# Patient Record
Sex: Female | Born: 1980 | Race: Black or African American | Hispanic: No | Marital: Single | Smoking: Current every day smoker
Health system: Southern US, Community
[De-identification: ages and names within clinical notes are randomized; demographics above are authoritative.]

## PROBLEM LIST (undated history)

## (undated) ENCOUNTER — Inpatient Hospital Stay (HOSPITAL_COMMUNITY): Payer: Self-pay

## (undated) DIAGNOSIS — R760 Raised antibody titer: Secondary | ICD-10-CM

## (undated) HISTORY — DX: Raised antibody titer: R76.0

---
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
## 2003-03-12 ENCOUNTER — Emergency Department (HOSPITAL_COMMUNITY): Admission: EM | Admit: 2003-03-12 | Discharge: 2003-03-12 | Payer: Self-pay | Admitting: *Deleted

## 2003-08-18 ENCOUNTER — Emergency Department (HOSPITAL_COMMUNITY): Admission: EM | Admit: 2003-08-18 | Discharge: 2003-08-19 | Payer: Self-pay | Admitting: Emergency Medicine

## 2004-07-24 ENCOUNTER — Inpatient Hospital Stay (HOSPITAL_COMMUNITY): Admission: AD | Admit: 2004-07-24 | Discharge: 2004-07-24 | Payer: Self-pay | Admitting: *Deleted

## 2004-07-27 ENCOUNTER — Inpatient Hospital Stay (HOSPITAL_COMMUNITY): Admission: AD | Admit: 2004-07-27 | Discharge: 2004-07-27 | Payer: Self-pay | Admitting: *Deleted

## 2004-08-18 ENCOUNTER — Inpatient Hospital Stay (HOSPITAL_COMMUNITY): Admission: AD | Admit: 2004-08-18 | Discharge: 2004-08-19 | Payer: Self-pay | Admitting: *Deleted

## 2004-08-24 ENCOUNTER — Emergency Department (HOSPITAL_COMMUNITY): Admission: EM | Admit: 2004-08-24 | Discharge: 2004-08-24 | Payer: Self-pay | Admitting: Emergency Medicine

## 2004-09-02 ENCOUNTER — Inpatient Hospital Stay (HOSPITAL_COMMUNITY): Admission: AD | Admit: 2004-09-02 | Discharge: 2004-09-02 | Payer: Self-pay | Admitting: Family Medicine

## 2004-09-09 ENCOUNTER — Inpatient Hospital Stay (HOSPITAL_COMMUNITY): Admission: AD | Admit: 2004-09-09 | Discharge: 2004-09-09 | Payer: Self-pay | Admitting: *Deleted

## 2004-10-25 ENCOUNTER — Ambulatory Visit (HOSPITAL_COMMUNITY): Admission: RE | Admit: 2004-10-25 | Discharge: 2004-10-25 | Payer: Self-pay | Admitting: Obstetrics and Gynecology

## 2004-12-21 ENCOUNTER — Ambulatory Visit (HOSPITAL_COMMUNITY): Admission: RE | Admit: 2004-12-21 | Discharge: 2004-12-21 | Payer: Self-pay | Admitting: Obstetrics and Gynecology

## 2005-03-05 ENCOUNTER — Inpatient Hospital Stay (HOSPITAL_COMMUNITY): Admission: AD | Admit: 2005-03-05 | Discharge: 2005-03-06 | Payer: Self-pay | Admitting: *Deleted

## 2005-03-05 ENCOUNTER — Ambulatory Visit: Payer: Self-pay | Admitting: *Deleted

## 2005-03-28 ENCOUNTER — Ambulatory Visit: Payer: Self-pay | Admitting: Family Medicine

## 2005-03-28 ENCOUNTER — Inpatient Hospital Stay (HOSPITAL_COMMUNITY): Admission: AD | Admit: 2005-03-28 | Discharge: 2005-03-31 | Payer: Self-pay | Admitting: Obstetrics & Gynecology

## 2007-09-25 ENCOUNTER — Ambulatory Visit (HOSPITAL_COMMUNITY): Admission: RE | Admit: 2007-09-25 | Discharge: 2007-09-25 | Payer: Self-pay | Admitting: Family Medicine

## 2007-10-07 ENCOUNTER — Ambulatory Visit: Payer: Self-pay | Admitting: Obstetrics & Gynecology

## 2007-11-11 ENCOUNTER — Ambulatory Visit: Payer: Self-pay | Admitting: Obstetrics & Gynecology

## 2007-12-02 ENCOUNTER — Encounter: Payer: Self-pay | Admitting: Obstetrics and Gynecology

## 2007-12-02 ENCOUNTER — Ambulatory Visit: Payer: Self-pay | Admitting: Obstetrics & Gynecology

## 2007-12-02 LAB — CONVERTED CEMR LAB
HCT: 34.9 % — ABNORMAL LOW (ref 36.0–46.0)
Hemoglobin: 11.8 g/dL — ABNORMAL LOW (ref 12.0–15.0)
MCHC: 33.8 g/dL (ref 30.0–36.0)
MCV: 92.8 fL (ref 78.0–100.0)
Platelets: 226 10*3/uL (ref 150–400)
RBC: 3.76 M/uL — ABNORMAL LOW (ref 3.87–5.11)
RDW: 13.8 % (ref 11.5–15.5)
WBC: 7.4 10*3/uL (ref 4.0–10.5)

## 2007-12-16 ENCOUNTER — Ambulatory Visit: Payer: Self-pay | Admitting: Obstetrics & Gynecology

## 2008-01-06 ENCOUNTER — Ambulatory Visit: Payer: Self-pay | Admitting: Obstetrics & Gynecology

## 2008-01-20 ENCOUNTER — Encounter: Payer: Self-pay | Admitting: Physician Assistant

## 2008-01-20 ENCOUNTER — Ambulatory Visit: Payer: Self-pay | Admitting: Obstetrics & Gynecology

## 2008-01-20 LAB — CONVERTED CEMR LAB
Chlamydia, DNA Probe: NEGATIVE
GC Probe Amp, Genital: NEGATIVE

## 2008-01-26 ENCOUNTER — Inpatient Hospital Stay (HOSPITAL_COMMUNITY): Admission: AD | Admit: 2008-01-26 | Discharge: 2008-01-26 | Payer: Self-pay | Admitting: Family Medicine

## 2008-01-26 ENCOUNTER — Ambulatory Visit: Payer: Self-pay | Admitting: Obstetrics & Gynecology

## 2008-01-27 ENCOUNTER — Ambulatory Visit: Payer: Self-pay | Admitting: Obstetrics & Gynecology

## 2008-02-03 ENCOUNTER — Ambulatory Visit: Payer: Self-pay | Admitting: Obstetrics & Gynecology

## 2008-02-10 ENCOUNTER — Ambulatory Visit: Payer: Self-pay | Admitting: Obstetrics & Gynecology

## 2008-02-15 ENCOUNTER — Inpatient Hospital Stay (HOSPITAL_COMMUNITY): Admission: AD | Admit: 2008-02-15 | Discharge: 2008-02-17 | Payer: Self-pay | Admitting: Family Medicine

## 2008-02-15 ENCOUNTER — Ambulatory Visit: Payer: Self-pay | Admitting: Obstetrics and Gynecology

## 2009-08-09 ENCOUNTER — Ambulatory Visit: Payer: Self-pay | Admitting: Obstetrics and Gynecology

## 2009-08-09 LAB — CONVERTED CEMR LAB
Clue Cells Wet Prep HPF POC: NONE SEEN
Hgb A2 Quant: 2.8 % (ref 2.2–3.2)
Hgb A: 96.8 % (ref 96.8–97.8)
Hgb F Quant: 0.4 % (ref 0.0–2.0)
Hgb S Quant: 0 % (ref 0.0–0.0)
Trich, Wet Prep: NONE SEEN
Yeast Wet Prep HPF POC: NONE SEEN

## 2009-08-15 ENCOUNTER — Ambulatory Visit (HOSPITAL_COMMUNITY): Admission: RE | Admit: 2009-08-15 | Discharge: 2009-08-15 | Payer: Self-pay | Admitting: Family Medicine

## 2009-09-06 ENCOUNTER — Ambulatory Visit: Payer: Self-pay | Admitting: Obstetrics and Gynecology

## 2009-09-06 ENCOUNTER — Encounter: Payer: Self-pay | Admitting: Obstetrics and Gynecology

## 2009-09-06 LAB — CONVERTED CEMR LAB
Antibody Screen: NEGATIVE
Basophils Absolute: 0 10*3/uL (ref 0.0–0.1)
Basophils Relative: 0 % (ref 0–1)
Eosinophils Absolute: 0 10*3/uL (ref 0.0–0.7)
Eosinophils Relative: 1 % (ref 0–5)
HCT: 38.1 % (ref 36.0–46.0)
Hemoglobin: 12.8 g/dL (ref 12.0–15.0)
Hepatitis B Surface Ag: NEGATIVE
Lymphocytes Relative: 34 % (ref 12–46)
Lymphs Abs: 2.4 10*3/uL (ref 0.7–4.0)
MCHC: 33.6 g/dL (ref 30.0–36.0)
MCV: 93.2 fL (ref 78.0–100.0)
Monocytes Absolute: 0.7 10*3/uL (ref 0.1–1.0)
Monocytes Relative: 11 % (ref 3–12)
Neutro Abs: 3.9 10*3/uL (ref 1.7–7.7)
Neutrophils Relative %: 55 % (ref 43–77)
Platelets: 267 10*3/uL (ref 150–400)
RBC: 4.09 M/uL (ref 3.87–5.11)
RDW: 14.1 % (ref 11.5–15.5)
Rh Type: POSITIVE
Rubella: 71.6 intl units/mL — ABNORMAL HIGH
WBC: 7.1 10*3/uL (ref 4.0–10.5)

## 2009-09-27 ENCOUNTER — Inpatient Hospital Stay (HOSPITAL_COMMUNITY): Admission: AD | Admit: 2009-09-27 | Discharge: 2009-09-27 | Payer: Self-pay | Admitting: Obstetrics & Gynecology

## 2009-09-27 ENCOUNTER — Ambulatory Visit: Payer: Self-pay | Admitting: Nurse Practitioner

## 2009-10-04 ENCOUNTER — Ambulatory Visit: Payer: Self-pay | Admitting: Obstetrics and Gynecology

## 2009-10-05 ENCOUNTER — Ambulatory Visit (HOSPITAL_COMMUNITY): Admission: RE | Admit: 2009-10-05 | Discharge: 2009-10-05 | Payer: Self-pay | Admitting: Family Medicine

## 2009-11-01 ENCOUNTER — Ambulatory Visit: Payer: Self-pay | Admitting: Obstetrics and Gynecology

## 2009-11-29 ENCOUNTER — Ambulatory Visit: Payer: Self-pay | Admitting: Obstetrics and Gynecology

## 2009-12-20 ENCOUNTER — Encounter: Payer: Self-pay | Admitting: Physician Assistant

## 2009-12-20 ENCOUNTER — Ambulatory Visit: Payer: Self-pay | Admitting: Obstetrics and Gynecology

## 2009-12-20 LAB — CONVERTED CEMR LAB
HCT: 35.8 % — ABNORMAL LOW (ref 36.0–46.0)
Hemoglobin: 12 g/dL (ref 12.0–15.0)
MCHC: 33.5 g/dL (ref 30.0–36.0)
MCV: 95.2 fL (ref 78.0–100.0)
Platelets: 243 10*3/uL (ref 150–400)
RBC: 3.76 M/uL — ABNORMAL LOW (ref 3.87–5.11)
RDW: 13.9 % (ref 11.5–15.5)
WBC: 7.6 10*3/uL (ref 4.0–10.5)

## 2010-01-10 ENCOUNTER — Ambulatory Visit: Payer: Self-pay | Admitting: Obstetrics and Gynecology

## 2010-01-24 ENCOUNTER — Ambulatory Visit: Payer: Self-pay | Admitting: Obstetrics and Gynecology

## 2010-02-07 ENCOUNTER — Encounter: Payer: Self-pay | Admitting: Obstetrics and Gynecology

## 2010-02-07 ENCOUNTER — Ambulatory Visit: Payer: Self-pay | Admitting: Obstetrics and Gynecology

## 2010-02-07 LAB — CONVERTED CEMR LAB
Chlamydia, DNA Probe: NEGATIVE
GC Probe Amp, Genital: NEGATIVE

## 2010-02-12 ENCOUNTER — Inpatient Hospital Stay (HOSPITAL_COMMUNITY)
Admission: AD | Admit: 2010-02-12 | Discharge: 2010-02-12 | Payer: Self-pay | Source: Home / Self Care | Attending: Obstetrics & Gynecology | Admitting: Obstetrics & Gynecology

## 2010-02-14 ENCOUNTER — Ambulatory Visit
Admission: RE | Admit: 2010-02-14 | Discharge: 2010-02-14 | Payer: Self-pay | Source: Home / Self Care | Attending: Obstetrics and Gynecology | Admitting: Obstetrics and Gynecology

## 2010-02-14 LAB — POCT URINALYSIS DIPSTICK
Bilirubin Urine: NEGATIVE
Hemoglobin, Urine: NEGATIVE
Ketones, ur: NEGATIVE mg/dL
Nitrite: NEGATIVE
Protein, ur: 30 mg/dL — AB
Specific Gravity, Urine: >=1.03 (ref 1.005–1.030)
Urine Glucose, Fasting: NEGATIVE mg/dL
Urobilinogen, UA: 0.2 mg/dL (ref 0.0–1.0)
pH: 6.5 (ref 5.0–8.0)

## 2010-02-17 ENCOUNTER — Inpatient Hospital Stay (HOSPITAL_COMMUNITY)
Admission: AD | Admit: 2010-02-17 | Discharge: 2010-02-20 | Payer: Self-pay | Source: Home / Self Care | Attending: Obstetrics & Gynecology | Admitting: Obstetrics & Gynecology

## 2010-02-21 ENCOUNTER — Ambulatory Visit: Admit: 2010-02-21 | Payer: Self-pay | Admitting: Obstetrics and Gynecology

## 2010-02-26 LAB — CBC
HCT: 36 % (ref 36.0–46.0)
HCT: 36.4 % (ref 36.0–46.0)
Hemoglobin: 12.3 g/dL (ref 12.0–15.0)
Hemoglobin: 12.2 g/dL (ref 12.0–15.0)
MCH: 31.8 pg (ref 26.0–34.0)
MCH: 31.4 pg (ref 26.0–34.0)
MCHC: 34.2 g/dL (ref 30.0–36.0)
MCHC: 33.5 g/dL (ref 30.0–36.0)
MCV: 93 fL (ref 78.0–100.0)
MCV: 93.8 fL (ref 78.0–100.0)
Platelets: 221 10*3/uL (ref 150–400)
Platelets: 214 10*3/uL (ref 150–400)
RBC: 3.87 MIL/uL (ref 3.87–5.11)
RBC: 3.88 MIL/uL (ref 3.87–5.11)
RDW: 14.3 % (ref 11.5–15.5)
RDW: 14.5 % (ref 11.5–15.5)
WBC: 8.7 10*3/uL (ref 4.0–10.5)
WBC: 10.4 10*3/uL (ref 4.0–10.5)

## 2010-02-26 LAB — RPR: RPR Ser Ql: NONREACTIVE

## 2010-03-28 ENCOUNTER — Ambulatory Visit: Payer: Self-pay | Admitting: Family Medicine

## 2010-04-23 LAB — POCT URINALYSIS DIPSTICK
Bilirubin Urine: NEGATIVE
Bilirubin Urine: NEGATIVE
Glucose, UA: NEGATIVE mg/dL
Glucose, UA: NEGATIVE mg/dL
Hgb urine dipstick: NEGATIVE
Hgb urine dipstick: NEGATIVE
Ketones, ur: NEGATIVE mg/dL
Ketones, ur: NEGATIVE mg/dL
Nitrite: NEGATIVE
Nitrite: NEGATIVE
Protein, ur: 30 mg/dL — AB
Protein, ur: NEGATIVE mg/dL
Specific Gravity, Urine: >=1.03 (ref 1.005–1.030)
Specific Gravity, Urine: >=1.03 (ref 1.005–1.030)
Urobilinogen, UA: 0.2 mg/dL (ref 0.0–1.0)
Urobilinogen, UA: 0.2 mg/dL (ref 0.0–1.0)
pH: 6.5 (ref 5.0–8.0)
pH: 6.5 (ref 5.0–8.0)

## 2010-04-24 LAB — POCT URINALYSIS DIPSTICK
Hgb urine dipstick: NEGATIVE
Ketones, ur: NEGATIVE mg/dL
Protein, ur: 30 mg/dL — AB
Protein, ur: NEGATIVE mg/dL
Specific Gravity, Urine: >=1.03 (ref 1.005–1.030)
Specific Gravity, Urine: >=1.03 (ref 1.005–1.030)
Urobilinogen, UA: 0.2 mg/dL (ref 0.0–1.0)
pH: 6.5 (ref 5.0–8.0)

## 2010-04-25 LAB — POCT URINALYSIS DIPSTICK
Hgb urine dipstick: NEGATIVE
Nitrite: NEGATIVE
Protein, ur: NEGATIVE mg/dL
Urobilinogen, UA: 0.2 mg/dL (ref 0.0–1.0)
pH: 6.5 (ref 5.0–8.0)

## 2010-04-26 LAB — POCT URINALYSIS DIPSTICK
Ketones, ur: NEGATIVE mg/dL
Nitrite: NEGATIVE
Protein, ur: NEGATIVE mg/dL
Protein, ur: NEGATIVE mg/dL
pH: 6 (ref 5.0–8.0)
pH: 6 (ref 5.0–8.0)

## 2010-04-26 LAB — URINALYSIS, ROUTINE W REFLEX MICROSCOPIC
Bilirubin Urine: NEGATIVE
Ketones, ur: NEGATIVE mg/dL
Nitrite: NEGATIVE
pH: 6 (ref 5.0–8.0)

## 2010-04-28 LAB — POCT URINALYSIS DIP (DEVICE)
Hgb urine dipstick: NEGATIVE
Protein, ur: NEGATIVE mg/dL
Specific Gravity, Urine: 1.025 (ref 1.005–1.030)
Urobilinogen, UA: 0.2 mg/dL (ref 0.0–1.0)

## 2010-04-29 LAB — POCT URINALYSIS DIP (DEVICE)
Glucose, UA: NEGATIVE mg/dL
Hgb urine dipstick: NEGATIVE
Nitrite: NEGATIVE
Protein, ur: NEGATIVE mg/dL
Urobilinogen, UA: 0.2 mg/dL (ref 0.0–1.0)
pH: 5.5 (ref 5.0–8.0)

## 2010-05-04 NOTE — Progress Notes (Signed)
Patricia Gray, Patricia Gray          ACCOUNT NO.:  [id number]  MEDICAL RECORD NO.:  [id number]           PATIENT TYPE:  A  LOCATION:  [facility]                   FACILITY:  [facility]  PHYSICIAN:  Lucina Mellow, DO   DATE OF BIRTH:  10-May-1980  DATE OF SERVICE:  03/28/2010                                 CLINIC NOTE  The patient was seen in the [facility] on the afternoon of March 28, 2010.  Her chief complaint was for a postpartum check.  HISTORY OF PRESENT ILLNESS:  The patient is a 29-year gravida 5, para 4- 0-1-4 status post spontaneous vaginal delivery on February 18, 2010.  She states that she is bottle feeding her infant, this is going well.  She is not having any breast pain but occasionally she does notice some breast leakage which she does not associate with any discomfort or concern.  She is not having any incontinence or dysuria.  She is concerned she has a hemorrhoid, although she has not noted any blood with wiping.  She states she does have pain with bowel movement and pain after her bowel movement.  She denies that she is having any "baby blues" but does report that her appetite is decreased.  She did not have much of an appetite during her pregnancy either.  This really has not changed but she is just concerned that maybe her body is not getting the nourishment that it needs.  She had a Depo shot at the time of discharge after delivery of her baby but she is interested perhaps in an IUD.  OBSTETRICAL HISTORY:  Significant for 5 pregnancies, 4 term deliveries by vagina and 1 spontaneous abortion.  Her most recent delivery was at 34 and 6 weeks here at [facility].  She had no vaginal lacerations or tears.  She has uncomplicated pregnancy course.  PHYSICAL EXAMINATION:  VITAL SIGNS:  She has a temperature of 98.4, pulse is 79, blood pressure of 131/81, weight of 209.3, height of 63 inches. GENERAL:  She is a pleasant African American female who looks her  stated age of 40 years of old. HEART:  Regular rate and rhythm with no murmurs. LUNGS:  Clear to auscultation bilaterally. ABDOMEN:  Soft and benign with positive bowel sounds. GENITOURINARY:  Her perineum and vagina are inspected.  She has no swelling or noted discharge on the outside.  Speculum exam was not performed. RECTAL:  She has what appears to be rectal fissure at about 6 o'clock. There is no hemorrhoid appreciated.  She has good rectal tone.  ASSESSMENT: 1. Postpartum exam.  The patient has no complaints of her postpartum     health at this time.  Her Edinburgh Postnatal Depression Scale     score is 430 which is in the low risk.  So no treatment necessary     at this time.  Her birth control currently is a Depo-Provera shot.     She wishes to have something else, perhaps an IUD, and she is going     to contact her insurance carrier and find out what her co-pay is     and follow up and make an appointment  for an IUD placement as     desired.  Otherwise, she needs to let us know if she wants another     Depo shot or if she wishes to go on birth control pills or other     form of birth control, so we can help her out. 2. Rectal fissure.  The patient states that her bowel movements are     loose, however, she does __________ after push to have a bowel     movement.  It is recommended that she get over-the-counter stool     softener such as Colace to take it 1-2 times a day.  She is given a     prescription for diltiazem gel which could be compounded at one of     the local pharmacies, recommended to go to [facility] to whom I     spoke to and to apply this 4 times a day rectally for at least a     month and see if this improves her symptoms.  If her symptoms     worsen or do not resolve, it is recommended that she follow up with     her  primary care physician so she can be seen by general surgeon     as necessary to address this issue as it may warrant.  The patient      voices understanding and agrees with these plans.          ______________________________ Lucina Mellow, DO    SH/MEDQ  D:  03/28/2010  T:  03/29/2010  Job:  [id number]

## 2010-05-28 LAB — CBC
MCHC: 33.7 g/dL (ref 30.0–36.0)
Platelets: 218 10*3/uL (ref 150–400)
RDW: 14.4 % (ref 11.5–15.5)

## 2010-05-28 LAB — RPR: RPR Ser Ql: NONREACTIVE

## 2010-11-12 LAB — POCT URINALYSIS DIP (DEVICE)
Bilirubin Urine: NEGATIVE
Bilirubin Urine: NEGATIVE
Glucose, UA: NEGATIVE
Glucose, UA: NEGATIVE
Ketones, ur: NEGATIVE
Ketones, ur: NEGATIVE
Nitrite: NEGATIVE
Nitrite: NEGATIVE
Operator id: 287931
pH: 7

## 2010-11-13 LAB — POCT URINALYSIS DIP (DEVICE)
Bilirubin Urine: NEGATIVE
Bilirubin Urine: NEGATIVE
Glucose, UA: NEGATIVE
Glucose, UA: NEGATIVE
Ketones, ur: NEGATIVE
Ketones, ur: NEGATIVE
Nitrite: NEGATIVE
Operator id: 148111
Protein, ur: 100 — AB
Specific Gravity, Urine: 1.02
pH: 7.5

## 2010-11-15 LAB — POCT URINALYSIS DIP (DEVICE)
Bilirubin Urine: NEGATIVE
Bilirubin Urine: NEGATIVE
Bilirubin Urine: NEGATIVE
Bilirubin Urine: NEGATIVE
Glucose, UA: NEGATIVE mg/dL
Glucose, UA: NEGATIVE mg/dL
Glucose, UA: NEGATIVE mg/dL
Glucose, UA: NEGATIVE mg/dL
Hgb urine dipstick: NEGATIVE
Hgb urine dipstick: NEGATIVE
Hgb urine dipstick: NEGATIVE
Ketones, ur: NEGATIVE mg/dL
Ketones, ur: NEGATIVE mg/dL
Ketones, ur: NEGATIVE mg/dL
Nitrite: NEGATIVE
Nitrite: NEGATIVE
Nitrite: NEGATIVE
Protein, ur: 30 mg/dL — AB
Protein, ur: 30 mg/dL — AB
Protein, ur: 30 mg/dL — AB
Specific Gravity, Urine: 1.015 (ref 1.005–1.030)
Specific Gravity, Urine: 1.015 (ref 1.005–1.030)
Specific Gravity, Urine: 1.02 (ref 1.005–1.030)
Specific Gravity, Urine: 1.025 (ref 1.005–1.030)
Urobilinogen, UA: 0.2 mg/dL (ref 0.0–1.0)
Urobilinogen, UA: 0.2 mg/dL (ref 0.0–1.0)
Urobilinogen, UA: 0.2 mg/dL (ref 0.0–1.0)
Urobilinogen, UA: 1 mg/dL (ref 0.0–1.0)
pH: 6.5 (ref 5.0–8.0)
pH: 6.5 (ref 5.0–8.0)
pH: 7 (ref 5.0–8.0)

## 2012-03-21 ENCOUNTER — Emergency Department (HOSPITAL_COMMUNITY)
Admission: EM | Admit: 2012-03-21 | Discharge: 2012-03-21 | Disposition: A | Discharge status: Home / Self Care | Payer: 59 | Attending: Emergency Medicine | Admitting: Emergency Medicine

## 2012-03-21 ENCOUNTER — Encounter (HOSPITAL_COMMUNITY): Payer: Self-pay | Admitting: *Deleted

## 2012-03-21 DIAGNOSIS — M545 Low back pain, unspecified: Secondary | ICD-10-CM | POA: Insufficient documentation

## 2012-03-21 DIAGNOSIS — Z79899 Other long term (current) drug therapy: Secondary | ICD-10-CM | POA: Insufficient documentation

## 2012-03-21 DIAGNOSIS — M549 Dorsalgia, unspecified: Secondary | ICD-10-CM

## 2012-03-21 MED ORDER — METHOCARBAMOL 500 MG PO TABS: 500.0000 mg | ORAL_TABLET | Freq: Two times a day (BID) | ORAL | Status: DC

## 2012-03-21 MED ORDER — HYDROCODONE-ACETAMINOPHEN 5-325 MG PO TABS: 2.0000 | ORAL_TABLET | ORAL | Status: DC | PRN

## 2012-03-21 NOTE — ED Notes (Signed)
To ED for eval of lower back pain for past cple of days. No urinary difficulty. Ambulatory. Thinks she may have hurt back while washing dishes

## 2012-03-21 NOTE — ED Notes (Signed)
Pt c/o lower back pain that started yesterday. Pt ambulatory. Pt sts she tried stretching but got no relief. Pt denies injury/trauma to back. No bruising or deformity seen. Pt in nad. resp e/u, skin warm and dry.

## 2012-04-02 NOTE — ED Provider Notes (Addendum)
History     CSN: [id number]  Arrival date & time 03/21/12  0907   First MD Initiated Contact with Patient 03/21/12 [phone]      Chief Complaint  Patient presents with  . Back Pain    (Consider location/radiation/quality/duration/timing/severity/associated sxs/prior treatment) Patient is a 32 y.o. female presenting with back pain. The history is provided by the patient.  Back Pain Location:  Lumbar spine Quality:  Aching Radiates to:  Does not radiate Pain severity:  Mild Pain is:  Same all the time Onset quality:  Gradual Duration:  1 day Timing:  Constant Progression:  Unchanged Chronicity:  New Context: not recent injury   Relieved by:  Nothing Worsened by:  Movement Ineffective treatments:  None tried Associated symptoms: no bladder incontinence, no bowel incontinence, no leg pain, no numbness, no perianal numbness and no tingling     History reviewed. No pertinent past medical history.  History reviewed. No pertinent past surgical history.  History reviewed. No pertinent family history.  History  Substance Use Topics  . Smoking status: Not on file  . Smokeless tobacco: Not on file  . Alcohol Use: Not on file    OB History   Grav Para Term Preterm Abortions TAB SAB Ect Mult Living                  Review of Systems  Gastrointestinal: Negative for bowel incontinence.  Genitourinary: Negative for bladder incontinence.  Musculoskeletal: Positive for back pain.  Neurological: Negative for tingling and numbness.  All other systems reviewed and are negative.    Allergies  Aspirin  Home Medications   Current Outpatient Rx  Name  Route  Sig  Dispense  Refill  . naproxen sodium (ANAPROX) 220 MG tablet   Oral   Take 440 mg by mouth daily as needed. For pain         . HYDROcodone-acetaminophen (NORCO/VICODIN) 5-325 MG per tablet   Oral   Take 2 tablets by mouth every 4 (four) hours as needed for pain.   10 tablet   0   . methocarbamol (ROBAXIN) 500  MG tablet   Oral   Take 1 tablet (500 mg total) by mouth 2 (two) times daily.   20 tablet   0     BP 124/66  Pulse 94  Temp(Src) 98.2 F (36.8 C) (Oral)  Resp 18  SpO2 97%  LMP 03/08/2012  Physical Exam  Nursing note and vitals reviewed. Constitutional: She is oriented to person, place, and time. She appears well-developed and well-nourished.  Non-toxic appearance. No distress.  HENT:  Head: Normocephalic and atraumatic.  Eyes: Conjunctivae, EOM and lids are normal. Pupils are equal, round, and reactive to light.  Neck: Normal range of motion. Neck supple. No tracheal deviation present. No mass present.  Cardiovascular: Normal rate, regular rhythm and normal heart sounds.  Exam reveals no gallop.   No murmur heard. Pulmonary/Chest: Effort normal and breath sounds normal. No stridor. No respiratory distress. She has no decreased breath sounds. She has no wheezes. She has no rhonchi. She has no rales.  Abdominal: Soft. Normal appearance and bowel sounds are normal. She exhibits no distension. There is no tenderness. There is no rebound and no CVA tenderness.  Musculoskeletal: Normal range of motion. She exhibits no edema and no tenderness.       Lumbar back: She exhibits pain and spasm.  Neurological: She is alert and oriented to person, place, and time. She has normal strength. No cranial  nerve deficit or sensory deficit. GCS eye subscore is 4. GCS verbal subscore is 5. GCS motor subscore is 6.  Skin: Skin is warm and dry. No abrasion and no rash noted.  Psychiatric: She has a normal mood and affect. Her speech is normal and behavior is normal.    ED Course  Procedures (including critical care time)  Labs Reviewed - No data to display No results found.   1. Back pain       MDM  Pt to be treated for musculoskeletal back pain        Toy Baker, MD 04/02/12 1130  Toy Baker, MD 04/02/12 1138  Toy Baker, MD 04/02/12 [phone]

## 2013-08-20 ENCOUNTER — Encounter (HOSPITAL_COMMUNITY): Payer: Self-pay | Admitting: Emergency Medicine

## 2013-08-20 ENCOUNTER — Emergency Department (INDEPENDENT_AMBULATORY_CARE_PROVIDER_SITE_OTHER)
Admission: EM | Admit: 2013-08-20 | Discharge: 2013-08-20 | Disposition: A | Discharge status: Home / Self Care | Payer: 59 | Source: Home / Self Care | Attending: Family Medicine | Admitting: Family Medicine

## 2013-08-20 DIAGNOSIS — M542 Cervicalgia: Secondary | ICD-10-CM

## 2013-08-20 DIAGNOSIS — M62838 Other muscle spasm: Secondary | ICD-10-CM

## 2013-08-20 MED ORDER — CYCLOBENZAPRINE HCL 10 MG PO TABS: 10.0000 mg | ORAL_TABLET | Freq: Three times a day (TID) | ORAL | Status: DC | PRN

## 2013-08-20 MED ORDER — MELOXICAM 15 MG PO TABS: ORAL_TABLET | ORAL | Status: DC

## 2013-08-20 NOTE — Discharge Instructions (Signed)
Muscle Cramps and Spasms Muscle cramps and spasms are when muscles tighten by themselves. They usually get better within minutes. Muscle cramps are painful. They are usually stronger and last longer than muscle spasms. Muscle spasms may or may not be painful. They can last a few seconds or much longer. HOME CARE  Drink enough fluid to keep your pee (urine) clear or pale yellow.  Massage, stretch, and relax the muscle.  Use a warm towel, heating pad, or warm shower water on tight muscles.  Place ice on the muscle if it is tender or in pain.  Put ice in a plastic bag.  Place a towel between your skin and the bag.  Leave the ice on for 15-20 minutes, 03-04 times a day.  Only take medicine as told by your doctor. GET HELP RIGHT AWAY IF:  Your cramps or spasms get worse, happen more often, or do not get better with time. MAKE SURE YOU:  Understand these instructions.  Will watch your condition.  Will get help right away if you are not doing well or get worse. Document Released: 01/11/2008 Document Revised: 05/25/2012 Document Reviewed: 01/15/2012 [facility] Patient Information 2015 [place], [place]. This information is not intended to replace advice given to you by your health care provider. Make sure you discuss any questions you have with your health care provider.    Use warm compresses to area with gentle massage. Take Mobic daily for 1 week then as needed. Use Muscle Relaxer as needed. Refer to Ortho f/u if pain persists, as you might need therapy. Hope you feel better, and best of luck.

## 2013-08-20 NOTE — ED Provider Notes (Signed)
CSN: [id number]     Arrival date & time 08/20/13  1454 History   First MD Initiated Contact with Patient 08/20/13 1636     Chief Complaint  Patient presents with  . Neck Pain   (Consider location/radiation/quality/duration/timing/severity/associated sxs/prior Treatment) HPI Comments: Ms. Natale MilchLancaster presents today with neck and right scapular pain. She awoke Wednesday with a "crick" in her neck, and it has worsened since that time. She denies extremity pain, numbness or weakness. No headache. ROM worsens her pain. Taking no medications, and continues to lift at work which aggravates her symptoms.   Patient is a 33 y.o. female presenting with neck pain. The history is provided by the patient.  Neck Pain   History reviewed. No pertinent past medical history. History reviewed. No pertinent past surgical history. No family history on file. History  Substance Use Topics  . Smoking status: Current Every Day Smoker  . Smokeless tobacco: Not on file  . Alcohol Use: Yes   OB History   Grav Para Term Preterm Abortions TAB SAB Ect Mult Living                 Review of Systems  Musculoskeletal: Positive for neck pain.  All other systems reviewed and are negative.   Allergies  Aspirin  Home Medications   Prior to Admission medications   Medication Sig Start Date End Date Taking? Authorizing Provider  cyclobenzaprine (FLEXERIL) 10 MG tablet Take 1 tablet (10 mg total) by mouth 3 (three) times daily as needed for muscle spasms. 08/20/13   Riki SheerMichelle G Cloyd Ragas, PA-C  HYDROcodone-acetaminophen (NORCO/VICODIN) 5-325 MG per tablet Take 2 tablets by mouth every 4 (four) hours as needed for pain. 03/21/12   Toy BakerAnthony T Allen, MD  meloxicam (MOBIC) 15 MG tablet 1 po q day for 1 week then as needed for neck pain 08/20/13   Riki SheerMichelle G Finley Dinkel, PA-C  methocarbamol (ROBAXIN) 500 MG tablet Take 1 tablet (500 mg total) by mouth 2 (two) times daily. 03/21/12   Toy BakerAnthony T Allen, MD  naproxen sodium (ANAPROX) 220 MG  tablet Take 440 mg by mouth daily as needed. For pain    Historical Provider, MD   BP 129/95  Pulse 74  Temp(Src) 98.2 F (36.8 C) (Oral)  Resp 12  SpO2 98%  LMP 07/21/2013 Physical Exam  Nursing note and vitals reviewed. Constitutional: She appears well-developed and well-nourished. No distress.  HENT:  Head: Normocephalic and atraumatic.  Neck: Neck supple.  Decreased and painful ROM with lateral rotation and flexion. Spasm noted in the right para scapular region with tenderness to palpation. No rigidity  Pulmonary/Chest: Effort normal.  Musculoskeletal: She exhibits tenderness. She exhibits no edema.  See neck  Neurological: She is alert. She displays normal reflexes. No cranial nerve deficit. She exhibits normal muscle tone. Coordination normal.  Skin: Skin is warm and dry. She is not diaphoretic.  Psychiatric: Her behavior is normal. Thought content normal.    ED Course  Procedures (including critical care time) Labs Review Labs Reviewed - No data to display  Imaging Review No results found.   MDM   1. Cervical paraspinous muscle spasm   2. Neck pain    Treat symptomatically with gentle massage, no heavy lifting, neck pillow, NSAIDs and prn muscle relaxer's. If worsens f/u with Ortho, may need PT if continues.     Riki SheerMichelle G Camryn Quesinberry, PA-C 08/20/13 [phone]

## 2013-08-20 NOTE — ED Notes (Signed)
Woke with neck pain, right side of neck and upper back.  Pain is getting worse, not better.  Denies any injury.  Patient did start a new job 3 weeks ago.  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  Patient is right handed

## 2013-08-21 NOTE — ED Provider Notes (Signed)
Medical screening examination/treatment/procedure(s) were performed by non-physician practitioner and as supervising physician I was immediately available for consultation/collaboration.  Ambreen Tufte, M.D.  Aayra Hornbaker C Navil Kole, MD 08/21/13 0851 

## 2014-03-18 ENCOUNTER — Encounter (HOSPITAL_COMMUNITY): Payer: Self-pay

## 2014-03-18 ENCOUNTER — Ambulatory Visit: Payer: BLUE CROSS/BLUE SHIELD

## 2014-03-18 ENCOUNTER — Emergency Department (INDEPENDENT_AMBULATORY_CARE_PROVIDER_SITE_OTHER)
Admission: EM | Admit: 2014-03-18 | Discharge: 2014-03-18 | Disposition: A | Discharge status: Home / Self Care | Payer: BLUE CROSS/BLUE SHIELD | Source: Home / Self Care | Attending: Emergency Medicine | Admitting: Emergency Medicine

## 2014-03-18 DIAGNOSIS — H6981 Other specified disorders of Eustachian tube, right ear: Secondary | ICD-10-CM

## 2014-03-18 DIAGNOSIS — T700XXA Otitic barotrauma, initial encounter: Secondary | ICD-10-CM

## 2014-03-18 NOTE — ED Notes (Signed)
Ear pain x couple of days

## 2014-03-18 NOTE — ED Provider Notes (Signed)
CSN: [id number]     Arrival date & time 03/18/14  1745 History   First MD Initiated Contact with Patient 03/18/14 1814     No chief complaint on file.  (Consider location/radiation/quality/duration/timing/severity/associated sxs/prior Treatment) HPI Comments: 34 year old female complaining of pain beneath the right ear and just posterior to the angle of the jaw. It is intermittent. It may occur for just a few seconds at a time. It started 3 days ago. She denies pain directly within the year and she denies having problems with hearing or tinnitus. Denies fever, sore throat or perceived PND. Patient has a history of allergies throughout the year and admits to having cold symptoms 2-3 weeks ago.   No past medical history on file. No past surgical history on file. No family history on file. History  Substance Use Topics  . Smoking status: Current Every Day Smoker  . Smokeless tobacco: Not on file  . Alcohol Use: Yes   OB History    No data available     Review of Systems  Constitutional: Negative.   HENT: Negative for congestion, dental problem, ear pain, postnasal drip and rhinorrhea.   Gastrointestinal: Negative.   Skin: Negative.   Neurological: Negative for dizziness, facial asymmetry and headaches.  All other systems reviewed and are negative.   Allergies  Aspirin  Home Medications   Prior to Admission medications   Medication Sig Start Date End Date Taking? Authorizing Provider  cyclobenzaprine (FLEXERIL) 10 MG tablet Take 1 tablet (10 mg total) by mouth 3 (three) times daily as needed for muscle spasms. 08/20/13   Riki SheerMichelle G Young, PA-C  HYDROcodone-acetaminophen (NORCO/VICODIN) 5-325 MG per tablet Take 2 tablets by mouth every 4 (four) hours as needed for pain. 03/21/12   Toy BakerAnthony T Allen, MD  meloxicam (MOBIC) 15 MG tablet 1 po q day for 1 week then as needed for neck pain 08/20/13   Riki SheerMichelle G Young, PA-C  methocarbamol (ROBAXIN) 500 MG tablet Take 1 tablet (500 mg total) by  mouth 2 (two) times daily. 03/21/12   Toy BakerAnthony T Allen, MD  naproxen sodium (ANAPROX) 220 MG tablet Take 440 mg by mouth daily as needed. For pain    Historical Provider, MD   BP 120/81 mmHg  Pulse 77  Temp(Src) 97.9 F (36.6 C) (Oral)  Resp 16  SpO2 100% Physical Exam  Constitutional: She appears well-developed and well-nourished. No distress.  HENT:  Head: Normocephalic and atraumatic.  Bilateral TMs pearly gray, transparent, no effusion or other abnormalities. EACs are patent. No foreign bodies. There is minor tenderness to the area of pain posterior to the angle of the jaw and just inferior to the ear lobe. No skin color changes. No swelling. No tenderness to the TMJ. P with minor cobblestoning to the right oropharynx. There is also clear thin PND.  Eyes: Conjunctivae and EOM are normal.  Neck: Normal range of motion. Neck supple.  Pulmonary/Chest: Effort normal. No respiratory distress.  Lymphadenopathy:    She has no cervical adenopathy.  Neurological: She is alert.  Skin: Skin is warm and dry. She is not diaphoretic.  Psychiatric: She has a normal mood and affect.  Nursing note and vitals reviewed.   ED Course  Procedures (including critical care time) Labs Review Labs Reviewed - No data to display  Imaging Review No results found.   MDM   1. Barotitis media, initial encounter   2. ETD (eustachian tube dysfunction), right    Allegra or claritin Sudafed PE 10 mg  Ibuprofen prn    Hayden Rasmussen, NP 03/18/14 [phone]

## 2014-03-18 NOTE — Discharge Instructions (Signed)
Barotitis Media Sudafed PE 10 mg Zyrtec, or claritin or allegra for drainage Barotitis media is inflammation of your middle ear. This occurs when the auditory tube (eustachian tube) leading from the back of your nose (nasopharynx) to your eardrum is blocked. This blockage may result from a cold, environmental allergies, or an upper respiratory infection. Unresolved barotitis media may lead to damage or hearing loss (barotrauma), which may become permanent. HOME CARE INSTRUCTIONS   Use medicines as recommended by your health care provider. Over-the-counter medicines will help unblock the canal and can help during times of air travel.  Do not put anything into your ears to clean or unplug them. Eardrops will not be helpful.  Do not swim, dive, or fly until your health care provider says it is all right to do so. If these activities are necessary, chewing gum with frequent, forceful swallowing may help. It is also helpful to hold your nose and gently blow to pop your ears for equalizing pressure changes. This forces air into the eustachian tube.  Only take over-the-counter or prescription medicines for pain, discomfort, or fever as directed by your health care provider.  A decongestant may be helpful in decongesting the middle ear and make pressure equalization easier. SEEK MEDICAL CARE IF:  You experience a serious form of dizziness in which you feel as if the room is spinning and you feel nauseated (vertigo).  Your symptoms only involve one ear. SEEK IMMEDIATE MEDICAL CARE IF:   You develop a severe headache, dizziness, or severe ear pain.  You have bloody or pus-like drainage from your ears.  You develop a fever.  Your problems do not improve or become worse. MAKE SURE YOU:   Understand these instructions.  Will watch your condition.  Will get help right away if you are not doing well or get worse. Document Released: 01/26/2000 Document Revised: 11/18/2012 Document Reviewed:  08/25/2012 [facility]Exit[facility] Patient Information 2015 [place]Care, [place]. This information is not intended to replace advice given to you by your health care provider. Make sure you discuss any questions you have with your health care provider.

## 2014-12-23 ENCOUNTER — Emergency Department (HOSPITAL_COMMUNITY)
Admission: EM | Admit: 2014-12-23 | Discharge: 2014-12-24 | Disposition: A | Discharge status: Home / Self Care | Payer: BLUE CROSS/BLUE SHIELD | Attending: Emergency Medicine | Admitting: Emergency Medicine

## 2014-12-23 ENCOUNTER — Encounter (HOSPITAL_COMMUNITY): Payer: Self-pay

## 2014-12-23 ENCOUNTER — Emergency Department (HOSPITAL_COMMUNITY): Payer: BLUE CROSS/BLUE SHIELD

## 2014-12-23 DIAGNOSIS — N83201 Unspecified ovarian cyst, right side: Secondary | ICD-10-CM | POA: Insufficient documentation

## 2014-12-23 DIAGNOSIS — Z3202 Encounter for pregnancy test, result negative: Secondary | ICD-10-CM | POA: Insufficient documentation

## 2014-12-23 DIAGNOSIS — N809 Endometriosis, unspecified: Secondary | ICD-10-CM

## 2014-12-23 DIAGNOSIS — N8003 Adenomyosis of the uterus: Secondary | ICD-10-CM

## 2014-12-23 DIAGNOSIS — Z79899 Other long term (current) drug therapy: Secondary | ICD-10-CM | POA: Insufficient documentation

## 2014-12-23 DIAGNOSIS — N939 Abnormal uterine and vaginal bleeding, unspecified: Secondary | ICD-10-CM

## 2014-12-23 DIAGNOSIS — N800 Endometriosis of uterus: Secondary | ICD-10-CM | POA: Insufficient documentation

## 2014-12-23 DIAGNOSIS — N72 Inflammatory disease of cervix uteri: Secondary | ICD-10-CM

## 2014-12-23 DIAGNOSIS — Z720 Tobacco use: Secondary | ICD-10-CM | POA: Insufficient documentation

## 2014-12-23 LAB — URINALYSIS, ROUTINE W REFLEX MICROSCOPIC
BILIRUBIN URINE: NEGATIVE
GLUCOSE, UA: NEGATIVE mg/dL
KETONES UR: 15 mg/dL — AB
NITRITE: NEGATIVE
PH: 5.5 (ref 5.0–8.0)
PROTEIN: NEGATIVE mg/dL
Specific Gravity, Urine: 1.014 (ref 1.005–1.030)
Urobilinogen, UA: 0.2 mg/dL (ref 0.0–1.0)

## 2014-12-23 LAB — CBC
HCT: 40.5 % (ref 36.0–46.0)
Hemoglobin: 13.9 g/dL (ref 12.0–15.0)
MCH: 33.1 pg (ref 26.0–34.0)
MCHC: 34.3 g/dL (ref 30.0–36.0)
MCV: 96.4 fL (ref 78.0–100.0)
PLATELETS: 239 10*3/uL (ref 150–400)
RBC: 4.2 MIL/uL (ref 3.87–5.11)
RDW: 13.9 % (ref 11.5–15.5)
WBC: 9 10*3/uL (ref 4.0–10.5)

## 2014-12-23 LAB — COMPREHENSIVE METABOLIC PANEL
ALK PHOS: 46 U/L (ref 38–126)
ALT: 19 U/L (ref 14–54)
AST: 24 U/L (ref 15–41)
Albumin: 3.7 g/dL (ref 3.5–5.0)
Anion gap: 8 (ref 5–15)
BUN: 11 mg/dL (ref 6–20)
CALCIUM: 8.7 mg/dL — AB (ref 8.9–10.3)
CO2: 25 mmol/L (ref 22–32)
CREATININE: 0.9 mg/dL (ref 0.44–1.00)
Chloride: 108 mmol/L (ref 101–111)
Glucose, Bld: 94 mg/dL (ref 65–99)
Potassium: 4 mmol/L (ref 3.5–5.1)
SODIUM: 141 mmol/L (ref 135–145)
Total Bilirubin: 0.5 mg/dL (ref 0.3–1.2)
Total Protein: 6.4 g/dL — ABNORMAL LOW (ref 6.5–8.1)

## 2014-12-23 LAB — URINE MICROSCOPIC-ADD ON

## 2014-12-23 LAB — I-STAT BETA HCG BLOOD, ED (MC, WL, AP ONLY): I-stat hCG, quantitative: <5 m[IU]/mL (ref <5)

## 2014-12-23 LAB — WET PREP, GENITAL
CLUE CELLS WET PREP: NONE SEEN
Trich, Wet Prep: NONE SEEN
Yeast Wet Prep HPF POC: NONE SEEN

## 2014-12-23 MED ORDER — MORPHINE SULFATE (PF) 4 MG/ML IV SOLN: 2.0000 mg | Freq: Once | INTRAVENOUS | Status: DC

## 2014-12-23 MED ORDER — CEFTRIAXONE SODIUM 250 MG IJ SOLR
250.0000 mg | Freq: Once | INTRAMUSCULAR | Status: AC
  Administered 2014-12-24: 250 mg via INTRAMUSCULAR
  Filled 2014-12-23: qty 250

## 2014-12-23 MED ORDER — AZITHROMYCIN 250 MG PO TABS
1000.0000 mg | ORAL_TABLET | Freq: Once | ORAL | Status: AC
  Administered 2014-12-24: 1000 mg via ORAL
  Filled 2014-12-23: qty 4

## 2014-12-23 NOTE — ED Notes (Signed)
Pt c/o LLQ abdominal pain and vaginal bleeding x 8 days.  Denies pain.  Pt reports pain felt like "period cramps."  Sts "my regular period was 2 week ago."  Hx of ovarian cyst while pregnant w/ her daughter.

## 2014-12-23 NOTE — ED Notes (Signed)
US tech at bedside

## 2014-12-23 NOTE — ED Provider Notes (Signed)
CSN: [id number]     Arrival date & time 12/23/14  1657 History   First MD Initiated Contact with Patient 12/23/14 2129     Chief Complaint  Patient presents with  . Abdominal Pain  . Vaginal Bleeding     (Consider location/radiation/quality/duration/timing/severity/associated sxs/prior Treatment) HPI Patricia Gray is a 34 y.o. female presents to ED with complaint of vaginal bleeding and LLQ pain. States she had a normal period about 3 wks ago. States about 8 days ago started to have vaginal bleeding again and states it is still there. States it is slowing down. Reports that she has never had irregular period in the past. Does report hx of ovarian cysts. Denies fever or chills. No change in bowel movements. No medications taken for this. Unsure if could be pregnant.   History reviewed. No pertinent past medical history. History reviewed. No pertinent past surgical history. History reviewed. No pertinent family history. Social History  Substance Use Topics  . Smoking status: Current Every Day Smoker -- 0.50 packs/day    Types: Cigarettes  . Smokeless tobacco: None  . Alcohol Use: Yes   OB History    No data available     Review of Systems  Constitutional: Negative for fever and chills.  Respiratory: Negative for cough, chest tightness and shortness of breath.   Cardiovascular: Negative for chest pain, palpitations and leg swelling.  Gastrointestinal: Positive for abdominal pain. Negative for nausea, vomiting and diarrhea.  Genitourinary: Positive for vaginal bleeding and pelvic pain. Negative for dysuria, flank pain, vaginal discharge and vaginal pain.  Musculoskeletal: Negative for myalgias, arthralgias, neck pain and neck stiffness.  Skin: Negative for rash.  Neurological: Negative for dizziness, weakness and headaches.  All other systems reviewed and are negative.     Allergies  Aspirin  Home Medications   Prior to Admission medications   Medication Sig Start  Date End Date Taking? Authorizing Provider  ibuprofen (ADVIL,MOTRIN) 200 MG tablet Take 600 mg by mouth every 6 (six) hours as needed for moderate pain.   Yes Historical Provider, MD  Iron-Vit C-Vit B12-Folic Acid (IRON 100 PLUS PO) Take 1 tablet by mouth daily.   Yes Historical Provider, MD  cyclobenzaprine (FLEXERIL) 10 MG tablet Take 1 tablet (10 mg total) by mouth 3 (three) times daily as needed for muscle spasms. Patient not taking: Reported on 12/23/2014 08/20/13   Riki Sheer, PA-C  HYDROcodone-acetaminophen (NORCO/VICODIN) 5-325 MG per tablet Take 2 tablets by mouth every 4 (four) hours as needed for pain. Patient not taking: Reported on 12/23/2014 03/21/12   Lorre Nick, MD  meloxicam (MOBIC) 15 MG tablet 1 po q day for 1 week then as needed for neck pain Patient not taking: Reported on 12/23/2014 08/20/13   Riki Sheer, PA-C  methocarbamol (ROBAXIN) 500 MG tablet Take 1 tablet (500 mg total) by mouth 2 (two) times daily. Patient not taking: Reported on 12/23/2014 03/21/12   Lorre Nick, MD   BP 118/65 mmHg  Pulse 63  Temp(Src) 97.9 F (36.6 C) (Oral)  Resp 18  SpO2 100%  LMP 12/09/2014 Physical Exam  Constitutional: She is oriented to person, place, and time. She appears well-developed and well-nourished. No distress.  HENT:  Head: Normocephalic.  Eyes: Conjunctivae are normal.  Neck: Neck supple.  Cardiovascular: Normal rate, regular rhythm and normal heart sounds.   Pulmonary/Chest: Effort normal and breath sounds normal. No respiratory distress. She has no wheezes. She has no rales.  Abdominal: Soft. Bowel sounds are normal.  She exhibits no distension. There is no tenderness. There is no rebound.  Genitourinary:  Normal external genitalia. Normal vaginal canal. Small thin white discharge. Cervix is normal, closed. mild CMT. No uterine tenderness. Mild left adnexal tenderness. No masses palpated.    Musculoskeletal: She exhibits no edema.  Neurological: She is  alert and oriented to person, place, and time.  Skin: Skin is warm and dry.  Psychiatric: She has a normal mood and affect. Her behavior is normal.  Nursing note and vitals reviewed.   ED Course  Procedures (including critical care time) Labs Review Labs Reviewed  COMPREHENSIVE METABOLIC PANEL - Abnormal; Notable for the following:    Calcium 8.7 (*)    Total Protein 6.4 (*)    All other components within normal limits  CBC  URINALYSIS, ROUTINE W REFLEX MICROSCOPIC (NOT AT [facility])  I-STAT BETA HCG BLOOD, ED ([facility], [facility], [facility] ONLY)    Imaging Review [place] Transvaginal Non-ob  12/23/2014  CLINICAL DATA:  Vaginal bleeding for 8 days EXAM: TRANSABDOMINAL AND TRANSVAGINAL ULTRASOUND OF PELVIS TECHNIQUE: Both transabdominal and transvaginal ultrasound examinations of the pelvis were performed. Transabdominal technique was performed for global imaging of the pelvis including uterus, ovaries, adnexal regions, and pelvic cul-de-sac. It was necessary to proceed with endovaginal exam following the transabdominal exam to visualize the ovaries. COMPARISON:  10/05/2009 FINDINGS: Uterus Measurements: 9 x 5 x 6 cm. There are small sub endometrial cysts about the endometrium, typical of adenomyosis. No focal mass lesion. Endometrium Thickness: 2 mm.  No focal mass. Right ovary Measurements: 52 x 26 x 24 mm. Asymmetrically enlarged secondary to a 32 mm collapsed/crenulated appearing predominantly cystic structure which is partly exophytic. Lobulated eccentric echoes favors adherent clot, but follow-up needed to document resolution and exclude mural nodule. Left ovary Measurements: 29 x 16 x 24 mm. Normal appearance/no adnexal mass. Other findings No free fluid. IMPRESSION: 1. Adenomyosis. 2. Complex 32 mm right ovarian cyst. Hemorrhagic cyst with adherent clot is favored but appearance is not diagnostic and 6-12 week follow-up is recommended to exclude cystic neoplasm. Electronically Signed   By: Marnee SpringJonathon  Watts M.D.   On:  12/23/2014 23:19   [place] Pelvis Complete  12/23/2014  CLINICAL DATA:  Vaginal bleeding for 8 days EXAM: TRANSABDOMINAL AND TRANSVAGINAL ULTRASOUND OF PELVIS TECHNIQUE: Both transabdominal and transvaginal ultrasound examinations of the pelvis were performed. Transabdominal technique was performed for global imaging of the pelvis including uterus, ovaries, adnexal regions, and pelvic cul-de-sac. It was necessary to proceed with endovaginal exam following the transabdominal exam to visualize the ovaries. COMPARISON:  10/05/2009 FINDINGS: Uterus Measurements: 9 x 5 x 6 cm. There are small sub endometrial cysts about the endometrium, typical of adenomyosis. No focal mass lesion. Endometrium Thickness: 2 mm.  No focal mass. Right ovary Measurements: 52 x 26 x 24 mm. Asymmetrically enlarged secondary to a 32 mm collapsed/crenulated appearing predominantly cystic structure which is partly exophytic. Lobulated eccentric echoes favors adherent clot, but follow-up needed to document resolution and exclude mural nodule. Left ovary Measurements: 29 x 16 x 24 mm. Normal appearance/no adnexal mass. Other findings No free fluid. IMPRESSION: 1. Adenomyosis. 2. Complex 32 mm right ovarian cyst. Hemorrhagic cyst with adherent clot is favored but appearance is not diagnostic and 6-12 week follow-up is recommended to exclude cystic neoplasm. Electronically Signed   By: Marnee SpringJonathon  Watts M.D.   On: 12/23/2014 23:19   I have personally reviewed and evaluated these images and lab results as part of my medical decision-making.  EKG Interpretation None      MDM   Final diagnoses:  Adenomyosis  Cervicitis  Cyst of right ovary   Pt with irregular vaginal bleeding and left adnexal pain. Will get pelvic exam. Labs normal. Pregnancy negative. [place] to ro ovarian cyst.   12:37 AM Ultrasound showing adenomyosis, right ovarian cyst. This was discussed with pt, she will follow up with ob/gyn. Pt's wet prep showing tntc wbc. Treated  for cervicitis with rocephin  im and zithromax 1 g PO. Will dc home with outpatient follow up. No evidence of PID. Afebrile. Otherwise non toxic appearing. Abdomen soft.   Filed Vitals:   12/23/14 1707 12/23/14 2144 12/24/14 0019  BP: 125/75 118/65 100/60  Pulse: 95 63 72  Temp: 98.2 F (36.8 C) 97.9 F (36.6 C) 99 F (37.2 C)  TempSrc: Oral Oral Oral  Resp: SpO2: 100% 100% 99%      Jaynie Crumble, PA-C 12/24/14 0039  Derwood Kaplan, MD 12/24/14 0134

## 2014-12-24 MED ORDER — LIDOCAINE HCL (PF) 1 % IJ SOLN
INTRAMUSCULAR | Status: AC
  Administered 2014-12-24: 5 mL
  Filled 2014-12-24: qty 5

## 2014-12-24 NOTE — Discharge Instructions (Signed)
You were treated today for possible pelvic infection. Take ibuprofen and tylenol for pain. Follow up with OB/GYN if symptoms not improving.    Abnormal Uterine Bleeding Abnormal uterine bleeding means bleeding from the vagina that is not your normal menstrual period. This can be:  Bleeding or spotting between periods.  Bleeding after sex (sexual intercourse).  Bleeding that is heavier or more than normal.  Periods that last longer than usual.  Bleeding after menopause. There are many problems that may cause this. Treatment will depend on the cause of the bleeding. Any kind of bleeding that is not normal should be reviewed by your doctor.  HOME CARE Watch your condition for any changes. These actions may lessen any discomfort you are having:  Do not use tampons or douches as told by your doctor.  Change your pads often. You should get regular pelvic exams and Pap tests. Keep all appointments for tests as told by your doctor. GET HELP IF:  You are bleeding for more than 1 week.  You feel dizzy at times. GET HELP RIGHT AWAY IF:   You pass out.  You have to change pads every 15 to 30 minutes.  You have belly pain.  You have a fever.  You become sweaty or weak.  You are passing large blood clots from the vagina.  You feel sick to your stomach (nauseous) and throw up (vomit). MAKE SURE YOU:  Understand these instructions.  Will watch your condition.  Will get help right away if you are not doing well or get worse.   This information is not intended to replace advice given to you by your health care provider. Make sure you discuss any questions you have with your health care provider.   Document Released: 11/25/2008 Document Revised: 02/02/2013 Document Reviewed: 08/27/2012 Elsevier Interactive Patient Education [employer]2016 Elsevier Inc.

## 2014-12-26 LAB — GC/CHLAMYDIA PROBE AMP (~~LOC~~) NOT AT ARMC
Chlamydia: POSITIVE — AB
Neisseria Gonorrhea: NEGATIVE

## 2014-12-27 ENCOUNTER — Telehealth (HOSPITAL_COMMUNITY): Payer: Self-pay

## 2014-12-27 NOTE — Telephone Encounter (Signed)
Spoke with pt. Verified ID. Informed of labs. Treated per protocol. [employer] form faxed. Pt informed to abstain from sexual activity x 10 days and to notify partner for testing and treatment.  

## 2015-08-12 ENCOUNTER — Emergency Department (HOSPITAL_COMMUNITY)
Admission: EM | Admit: 2015-08-12 | Discharge: 2015-08-12 | Disposition: A | Discharge status: Home / Self Care | Payer: Medicaid Other | Attending: Emergency Medicine | Admitting: Emergency Medicine

## 2015-08-12 ENCOUNTER — Encounter (HOSPITAL_COMMUNITY): Payer: Self-pay

## 2015-08-12 DIAGNOSIS — Z3201 Encounter for pregnancy test, result positive: Secondary | ICD-10-CM | POA: Insufficient documentation

## 2015-08-12 DIAGNOSIS — O99331 Smoking (tobacco) complicating pregnancy, first trimester: Secondary | ICD-10-CM | POA: Insufficient documentation

## 2015-08-12 DIAGNOSIS — O209 Hemorrhage in early pregnancy, unspecified: Secondary | ICD-10-CM | POA: Insufficient documentation

## 2015-08-12 DIAGNOSIS — F17210 Nicotine dependence, cigarettes, uncomplicated: Secondary | ICD-10-CM | POA: Diagnosis not present

## 2015-08-12 DIAGNOSIS — Z3A13 13 weeks gestation of pregnancy: Secondary | ICD-10-CM | POA: Diagnosis not present

## 2015-08-12 LAB — URINALYSIS, ROUTINE W REFLEX MICROSCOPIC
BILIRUBIN URINE: NEGATIVE
Glucose, UA: NEGATIVE mg/dL
Ketones, ur: NEGATIVE mg/dL
Leukocytes, UA: NEGATIVE
NITRITE: NEGATIVE
PH: 6 (ref 5.0–8.0)
Protein, ur: NEGATIVE mg/dL
SPECIFIC GRAVITY, URINE: 1.022 (ref 1.005–1.030)

## 2015-08-12 LAB — CBC WITH DIFFERENTIAL/PLATELET
BASOS ABS: 0 10*3/uL (ref 0.0–0.1)
BASOS PCT: 0 %
EOS PCT: 1 %
Eosinophils Absolute: 0.1 10*3/uL (ref 0.0–0.7)
HCT: 34.6 % — ABNORMAL LOW (ref 36.0–46.0)
Hemoglobin: 12.2 g/dL (ref 12.0–15.0)
Lymphocytes Relative: 25 %
Lymphs Abs: 1.9 10*3/uL (ref 0.7–4.0)
MCH: 31.9 pg (ref 26.0–34.0)
MCHC: 35.3 g/dL (ref 30.0–36.0)
MCV: 90.6 fL (ref 78.0–100.0)
MONO ABS: 0.6 10*3/uL (ref 0.1–1.0)
Monocytes Relative: 8 %
Neutro Abs: 5.2 10*3/uL (ref 1.7–7.7)
Neutrophils Relative %: 66 %
PLATELETS: 284 10*3/uL (ref 150–400)
RBC: 3.82 MIL/uL — ABNORMAL LOW (ref 3.87–5.11)
RDW: 12.9 % (ref 11.5–15.5)
WBC: 7.8 10*3/uL (ref 4.0–10.5)

## 2015-08-12 LAB — COMPREHENSIVE METABOLIC PANEL
ALBUMIN: 3.2 g/dL — AB (ref 3.5–5.0)
ALK PHOS: 30 U/L — AB (ref 38–126)
ALT: 17 U/L (ref 14–54)
ANION GAP: 7 (ref 5–15)
AST: 18 U/L (ref 15–41)
BUN: 6 mg/dL (ref 6–20)
CALCIUM: 8.7 mg/dL — AB (ref 8.9–10.3)
CO2: 19 mmol/L — ABNORMAL LOW (ref 22–32)
Chloride: 110 mmol/L (ref 101–111)
Creatinine, Ser: 0.56 mg/dL (ref 0.44–1.00)
GFR calc Af Amer: >60 mL/min (ref >60)
GFR calc non Af Amer: >60 mL/min (ref >60)
GLUCOSE: 89 mg/dL (ref 65–99)
Potassium: 3.5 mmol/L (ref 3.5–5.1)
Sodium: 136 mmol/L (ref 135–145)
TOTAL PROTEIN: 5.9 g/dL — AB (ref 6.5–8.1)
Total Bilirubin: 0.6 mg/dL (ref 0.3–1.2)

## 2015-08-12 LAB — HCG, QUANTITATIVE, PREGNANCY: hCG, Beta Chain, Quant, S: 48657 m[IU]/mL — ABNORMAL HIGH (ref <5)

## 2015-08-12 LAB — POC URINE PREG, ED: PREG TEST UR: POSITIVE — AB

## 2015-08-12 LAB — URINE MICROSCOPIC-ADD ON: WBC UA: NONE SEEN WBC/hpf (ref 0–5)

## 2015-08-12 NOTE — ED Notes (Addendum)
Upon assessment pt denies n/v/d, reports last BM yesterday, denies vaginal discharge or odor, reports some moderate vaginal bleeding without clots and abdominal cramping onset today.

## 2015-08-12 NOTE — ED Provider Notes (Signed)
CSN: [id number]     Arrival date & time 08/12/15  0734 History   First MD Initiated Contact with Patient 08/12/15 0800     Chief Complaint  Patient presents with  . Vaginal Bleeding  . Possible Pregnancy   HPI Comments: 35 year old [id number] female presents with vaginal bleeding. She states her LMP was ~2 months ago. She states she had intercourse this morning and began to have vaginal bleeding. She states it is more than spotting however she has not seen clots. She has not used more than 1 pad. Reports associated lower abdominal cramping. She is not established with an OBGYN yet. Denies syncope, lightheadedness, N/V/D, vaginal discharge, irritative voiding symptoms.  Patient is a 35 y.o. female presenting with vaginal bleeding and pregnancy problem.  Vaginal Bleeding Associated symptoms: abdominal pain   Associated symptoms: no dyspareunia, no dysuria, no nausea and no vaginal discharge   Possible Pregnancy Associated symptoms include abdominal pain. Pertinent negatives include no chest pain, nausea or vomiting.    History reviewed. No pertinent past medical history. History reviewed. No pertinent past surgical history. History reviewed. No pertinent family history. Social History  Substance Use Topics  . Smoking status: Current Every Day Smoker -- 0.50 packs/day    Types: Cigarettes  . Smokeless tobacco: None  . Alcohol Use: Yes   OB History    No data available     Review of Systems  Respiratory: Negative for shortness of breath.   Cardiovascular: Negative for chest pain.  Gastrointestinal: Positive for abdominal pain. Negative for nausea, vomiting and diarrhea.  Genitourinary: Positive for vaginal bleeding. Negative for dysuria, flank pain, vaginal discharge, menstrual problem and dyspareunia.  All other systems reviewed and are negative.     Allergies  Aspirin  Home Medications   Prior to Admission medications   Not on File   BP 119/73 mmHg  Pulse 82  Temp(Src) 98.6  F (37 C) (Oral)  Resp 17  SpO2 98%  LMP 05/24/2015   Physical Exam  Constitutional: She is oriented to person, place, and time. She appears well-developed and well-nourished. No distress.  HENT:  Head: Normocephalic and atraumatic.  Eyes: Conjunctivae are normal. Pupils are equal, round, and reactive to light. Right eye exhibits no discharge. Left eye exhibits no discharge. No scleral icterus.  Neck: Normal range of motion.  Cardiovascular: Normal rate and regular rhythm.  Exam reveals no gallop and no friction rub.   No murmur heard. Pulmonary/Chest: Effort normal and breath sounds normal. No respiratory distress. She has no wheezes. She has no rales. She exhibits no tenderness.  Abdominal: Soft. Bowel sounds are normal. She exhibits no distension and no mass. There is tenderness. There is no rebound and no guarding.  Mild suprapubic tenderness and LLQ tenderness  Genitourinary:  No inguinal lymphadenopathy or inguinal hernia noted. Normal external genitalia. No pain with speculum insertion. Closed cervical os with normal appearance - no rash or lesions. Moderate amount of blood in vaginal vault. Chaperone present during exam.    Neurological: She is alert and oriented to person, place, and time.  Skin: Skin is warm and dry.  Psychiatric: She has a normal mood and affect. Her behavior is normal.    ED Course  Procedures (including critical care time) Labs Review Labs Reviewed  URINALYSIS, ROUTINE W REFLEX MICROSCOPIC (NOT AT [facility]) - Abnormal; Notable for the following:    Hgb urine dipstick MODERATE (*)    All other components within normal limits  COMPREHENSIVE METABOLIC PANEL -  Abnormal; Notable for the following:    CO2 19 (*)    Calcium 8.7 (*)    Total Protein 5.9 (*)    Albumin 3.2 (*)    Alkaline Phosphatase 30 (*)    All other components within normal limits  CBC WITH DIFFERENTIAL/PLATELET - Abnormal; Notable for the following:    RBC 3.82 (*)    HCT 34.6 (*)     All other components within normal limits  HCG, QUANTITATIVE, PREGNANCY - Abnormal; Notable for the following:    hCG, Beta Chain, Quant, S [id number] (*)    All other components within normal limits  URINE MICROSCOPIC-ADD ON - Abnormal; Notable for the following:    Squamous Epithelial / LPF 0-5 (*)    Bacteria, UA RARE (*)    All other components within normal limits  POC URINE PREG, ED - Abnormal; Notable for the following:    Preg Test, Ur POSITIVE (*)    All other components within normal limits    Imaging Review No results found. I have personally reviewed and evaluated these images and lab results as part of my medical decision-making.   EKG Interpretation None      MDM   Final diagnoses:  Vaginal bleeding in pregnancy, first trimester   35 year old female who presents with post-coital vaginal bleeding. Blood was noted in vaginal vault but Os appears closed. Transabdominal US performed by Dr. Adela LankFloyd [facility] 13 [facility] 3 days with FHT of 150. hcg quant is 48,657 consistent with EGA seen on US. Most likely this is just bleeding after intercourse however discussed with patient the possibility of miscarriage. Labs are overall unremarkable. UA is clean. Patient advised to follow up with OBGYN for prenatal visits. Patient is NAD, non-toxic, with stable VS. Patient is informed of clinical course, understands medical decision making process, and agrees with plan. Opportunity for questions provided and all questions answered. Return precautions given.    Bethel BornKelly Marie Elliott Lasecki, PA-C 08/12/15 1043  Melene Planan Floyd, DO 08/12/15 1051

## 2015-08-12 NOTE — ED Notes (Signed)
Pt c/o abdominal pressure and increasing vaginal bleeding after intercourse this morning.  Pain score 2/10.  Pt reports having a positive home pregnancy test earlier this month.  Sts last period in April 2017.  Pt reports "it looks like my period is coming on and feels like period cramps."

## 2015-08-22 ENCOUNTER — Encounter (HOSPITAL_COMMUNITY): Payer: Self-pay | Admitting: Medical

## 2015-08-22 ENCOUNTER — Inpatient Hospital Stay (HOSPITAL_COMMUNITY)
Admission: AD | Admit: 2015-08-22 | Discharge: 2015-08-23 | Disposition: A | Discharge status: Home / Self Care | Payer: Medicaid Other | Source: Ambulatory Visit | Attending: Obstetrics & Gynecology | Admitting: Obstetrics & Gynecology

## 2015-08-22 DIAGNOSIS — A5901 Trichomonal vulvovaginitis: Secondary | ICD-10-CM | POA: Insufficient documentation

## 2015-08-22 DIAGNOSIS — Z3A12 12 weeks gestation of pregnancy: Secondary | ICD-10-CM | POA: Diagnosis not present

## 2015-08-22 DIAGNOSIS — O99331 Smoking (tobacco) complicating pregnancy, first trimester: Secondary | ICD-10-CM | POA: Insufficient documentation

## 2015-08-22 DIAGNOSIS — R102 Pelvic and perineal pain: Secondary | ICD-10-CM | POA: Diagnosis present

## 2015-08-22 DIAGNOSIS — A599 Trichomoniasis, unspecified: Secondary | ICD-10-CM

## 2015-08-22 DIAGNOSIS — O98313 Other infections with a predominantly sexual mode of transmission complicating pregnancy, third trimester: Secondary | ICD-10-CM | POA: Diagnosis not present

## 2015-08-22 LAB — URINALYSIS, ROUTINE W REFLEX MICROSCOPIC
BILIRUBIN URINE: NEGATIVE
Glucose, UA: NEGATIVE mg/dL
Hgb urine dipstick: NEGATIVE
KETONES UR: 40 mg/dL — AB
Nitrite: NEGATIVE
PH: 6 (ref 5.0–8.0)
PROTEIN: NEGATIVE mg/dL

## 2015-08-22 LAB — WET PREP, GENITAL
Clue Cells Wet Prep HPF POC: NONE SEEN
Sperm: NONE SEEN
Yeast Wet Prep HPF POC: NONE SEEN

## 2015-08-22 LAB — URINE MICROSCOPIC-ADD ON

## 2015-08-22 MED ORDER — METRONIDAZOLE 500 MG PO TABS
2000.0000 mg | ORAL_TABLET | Freq: Once | ORAL | Status: AC
  Administered 2015-08-22: 2000 mg via ORAL
  Filled 2015-08-22: qty 4

## 2015-08-22 NOTE — MAU Provider Note (Signed)
History     CSN: [id number]  Arrival date and time: 08/22/15 2146   First Provider Initiated Contact with Patient 08/22/15 2257      Chief Complaint  Patient presents with  . Vaginal Pain   HPI  Ms. Patricia Gray is a 35 y.o. [id number] at [redacted]w[redacted]d who presents to MAU today with complaint of vaginal pain. The patient states that she has noted internal vaginal pain x 2 days. She has also noted a small amount of thin, white discharge without odor. She denies vaginal bleeding, but has had itching and possible vaginal swelling. She denies abdominal pain or fever. Last intercourse was yesterday.   OB History    Gravida Para Term Preterm AB TAB SAB Ectopic Multiple Living   History reviewed. No pertinent past medical history.  History reviewed. No pertinent past surgical history.  History reviewed. No pertinent family history.  Social History  Substance Use Topics  . Smoking status: Current Every Day Smoker -- 0.50 packs/day    Types: Cigarettes  . Smokeless tobacco: None  . Alcohol Use: Yes    Allergies:  Allergies  Allergen Reactions  . Aspirin Hives    No prescriptions prior to admission    Review of Systems  Constitutional: Negative for fever and malaise/fatigue.  Gastrointestinal: Negative for abdominal pain.  Genitourinary: Negative for dysuria, urgency and frequency.       + discharge, itching, swelling Neg - vaginal bleeding   Physical Exam   Blood pressure 124/60, pulse 92, temperature 98.1 F (36.7 C), temperature source Oral, resp. rate 18, last menstrual period 05/24/2015.  Physical Exam  Nursing note and vitals reviewed. Constitutional: She is oriented to person, place, and time. She appears well-developed and well-nourished. No distress.  HENT:  Head: Normocephalic and atraumatic.  Cardiovascular: Normal rate.   Respiratory: Effort normal.  GI: Soft. She exhibits no distension and no mass. There is no tenderness. There is  no rebound and no guarding.  Genitourinary:    Uterus is enlarged. Uterus is not tender. Cervix exhibits no motion tenderness, no discharge and no friability. No bleeding in the vagina. Vaginal discharge (small amount of thin, white discharge noted) found.  Neurological: She is alert and oriented to person, place, and time.  Skin: Skin is warm and dry. No erythema.  Psychiatric: She has a normal mood and affect.    Results for orders placed or performed during the hospital encounter of 08/22/15 (from the past 24 hour(s))  Urinalysis, Routine w reflex microscopic (not at [facility])     Status: Abnormal   Collection Time: 08/22/15 10:17 PM  Result Value Ref Range   Color, Urine YELLOW YELLOW   APPearance CLEAR CLEAR   Specific Gravity, Urine >1.030 (H) 1.005 - 1.030   pH 6.0 5.0 - 8.0   Glucose, UA NEGATIVE NEGATIVE mg/dL   Hgb urine dipstick NEGATIVE NEGATIVE   Bilirubin Urine NEGATIVE NEGATIVE   Ketones, ur 40 (A) NEGATIVE mg/dL   Protein, ur NEGATIVE NEGATIVE mg/dL   Nitrite NEGATIVE NEGATIVE   Leukocytes, UA TRACE (A) NEGATIVE  Urine microscopic-add on     Status: Abnormal   Collection Time: 08/22/15 10:17 PM  Result Value Ref Range   Squamous Epithelial / LPF 0-5 (A) NONE SEEN   WBC, UA 0-5 0 - 5 WBC/hpf   RBC / HPF 0-5 0 - 5 RBC/hpf   Bacteria, UA RARE (A) NONE SEEN  Wet prep, genital     Status: Abnormal   Collection Time: 08/22/15 11:03 PM  Result Value Ref Range   Yeast Wet Prep HPF POC NONE SEEN NONE SEEN   Trich, Wet Prep PRESENT (A) NONE SEEN   Clue Cells Wet Prep HPF POC NONE SEEN NONE SEEN   WBC, Wet Prep HPF POC MODERATE (A) NONE SEEN   Sperm NONE SEEN     MAU Course  Procedures None  MDM FHR - 143 bpm with doppler UA today without evidence of infection Wet prep today  2 G Flagyl given in MAU for trichomonas   Assessment and Plan  A: Trichomonas   P: Discharge home Treated in MAU. Partner treatment advised First trimester precautions  discussed Patient advised to follow-up with [facility] as planned to start prenatal care Pregnancy confirmation letter given at patient's request Patient may return to MAU as needed or if her condition were to change or worsen   Marny LowensteinJulie N Wenzel, PA-C  08/22/2015, 11:51 PM

## 2015-08-22 NOTE — Discharge Instructions (Signed)
Trichomoniasis Trichomoniasis is an infection caused by an organism called Trichomonas. The infection can affect both women and men. In women, the outer female genitalia and the vagina are affected. In men, the penis is mainly affected, but the prostate and other reproductive organs can also be involved. Trichomoniasis is a sexually transmitted infection (STI) and is most often passed to another person through sexual contact.  RISK FACTORS  Having unprotected sexual intercourse.  Having sexual intercourse with an infected partner. SIGNS AND SYMPTOMS  Symptoms of trichomoniasis in women include:  Abnormal gray-green frothy vaginal discharge.  Itching and irritation of the vagina.  Itching and irritation of the area outside the vagina. Symptoms of trichomoniasis in men include:   Penile discharge with or without pain.  Pain during urination. This results from inflammation of the urethra. DIAGNOSIS  Trichomoniasis may be found during a Pap test or physical exam. Your health care provider may use one of the following methods to help diagnose this infection:  Testing the pH of the vagina with a test tape.  Using a vaginal swab test that checks for the Trichomonas organism. A test is available that provides results within a few minutes.  Examining a urine sample.  Testing vaginal secretions. Your health care provider may test you for other STIs, including HIV. TREATMENT   You may be given medicine to fight the infection. Women should inform their health care provider if they could be or are pregnant. Some medicines used to treat the infection should not be taken during pregnancy.  Your health care provider may recommend over-the-counter medicines or creams to decrease itching or irritation.  Your sexual partner will need to be treated if infected.  Your health care provider may test you for infection again 3 months after treatment. HOME CARE INSTRUCTIONS   Take medicines only as  directed by your health care provider.  Take over-the-counter medicine for itching or irritation as directed by your health care provider.  Do not have sexual intercourse while you have the infection.  Women should not douche or wear tampons while they have the infection.  Discuss your infection with your partner. Your partner may have gotten the infection from you, or you may have gotten it from your partner.  Have your sex partner get examined and treated if necessary.  Practice safe, informed, and protected sex.  See your health care provider for other STI testing. SEEK MEDICAL CARE IF:   You still have symptoms after you finish your medicine.  You develop abdominal pain.  You have pain when you urinate.  You have bleeding after sexual intercourse.  You develop a rash.  Your medicine makes you sick or makes you throw up (vomit). MAKE SURE YOU:  Understand these instructions.  Will watch your condition.  Will get help right away if you are not doing well or get worse.   This information is not intended to replace advice given to you by your health care provider. Make sure you discuss any questions you have with your health care provider.   Document Released: 07/24/2000 Document Revised: 02/18/2014 Document Reviewed: 11/09/2012 Elsevier Interactive Patient Education 2016 Elsevier Inc.  

## 2015-08-22 NOTE — MAU Note (Signed)
Pt presents complaining of pain in her vagina that started 2 days ago. Has not tried pain medication. Denies any bumps or ulcers. Feels more swollen. Denies vaginal bleeding or abnormal discharge.

## 2015-09-18 LAB — OB RESULTS CONSOLE RPR: RPR: NONREACTIVE

## 2015-09-18 LAB — OB RESULTS CONSOLE HIV ANTIBODY (ROUTINE TESTING): HIV: NONREACTIVE

## 2015-09-25 ENCOUNTER — Encounter: Payer: BLUE CROSS/BLUE SHIELD | Admitting: Obstetrics and Gynecology

## 2015-11-25 ENCOUNTER — Encounter (HOSPITAL_COMMUNITY): Payer: Self-pay | Admitting: *Deleted

## 2015-11-25 ENCOUNTER — Inpatient Hospital Stay (HOSPITAL_COMMUNITY)
Admission: AD | Admit: 2015-11-25 | Discharge: 2015-11-25 | Disposition: A | Discharge status: Home / Self Care | Payer: Medicaid Other | Source: Ambulatory Visit | Attending: Obstetrics and Gynecology | Admitting: Obstetrics and Gynecology

## 2015-11-25 DIAGNOSIS — O26892 Other specified pregnancy related conditions, second trimester: Secondary | ICD-10-CM | POA: Diagnosis not present

## 2015-11-25 DIAGNOSIS — O26893 Other specified pregnancy related conditions, third trimester: Secondary | ICD-10-CM

## 2015-11-25 DIAGNOSIS — F17210 Nicotine dependence, cigarettes, uncomplicated: Secondary | ICD-10-CM | POA: Insufficient documentation

## 2015-11-25 DIAGNOSIS — Z3A26 26 weeks gestation of pregnancy: Secondary | ICD-10-CM | POA: Diagnosis not present

## 2015-11-25 DIAGNOSIS — R109 Unspecified abdominal pain: Secondary | ICD-10-CM | POA: Insufficient documentation

## 2015-11-25 DIAGNOSIS — O99332 Smoking (tobacco) complicating pregnancy, second trimester: Secondary | ICD-10-CM | POA: Diagnosis not present

## 2015-11-25 LAB — URINALYSIS, ROUTINE W REFLEX MICROSCOPIC
BILIRUBIN URINE: NEGATIVE
Glucose, UA: NEGATIVE mg/dL
Hgb urine dipstick: NEGATIVE
Ketones, ur: >80 mg/dL — AB
LEUKOCYTES UA: NEGATIVE
NITRITE: NEGATIVE
PH: 6 (ref 5.0–8.0)
Protein, ur: NEGATIVE mg/dL
SPECIFIC GRAVITY, URINE: 1.025 (ref 1.005–1.030)

## 2015-11-25 LAB — WET PREP, GENITAL
CLUE CELLS WET PREP: NONE SEEN
SPERM: NONE SEEN
TRICH WET PREP: NONE SEEN
Yeast Wet Prep HPF POC: NONE SEEN

## 2015-11-25 MED ORDER — LACTATED RINGERS IV BOLUS (SEPSIS)
1000.0000 mL | Freq: Once | INTRAVENOUS | Status: AC
  Administered 2015-11-25: 1000 mL via INTRAVENOUS

## 2015-11-25 NOTE — MAU Note (Signed)
Patient states she started having non painful contractions around 1430 that were coming every few minutes.  She had a "few on the drive over, but they have stopped now."  Pt states they were never painful.  No vaginal LOF or bleeding.  Feeling +fetal movement.  States she has not been drinking very much today.  Urine sample obtained.

## 2015-11-25 NOTE — MAU Provider Note (Signed)
Chief Complaint:  Contractions   HPI: Patricia Gray is a 35 y.o. [phone] at [redacted]w[redacted]d who presents to maternity admissions reporting Contractions.  Patient states she was noticing contraction like pain, not very painful that started this afternoon around 2:30pm, Contractions were every few minutes. Patient was able to drive herself over without too much discomfort and only a few contractions during driving, not needing to breathe through them. Does not feel like usual contractions in labor but wanted to make sure.  Denies  leakage of fluid or vaginal bleeding. Good fetal movement.     Past Medical History: History reviewed. No pertinent past medical history.  Past obstetric history: OB History  Gravida Para Term Preterm AB Living  6 4 4  0 1 4  SAB TAB Ectopic Multiple Live Births  0 1 0 0 4    # Outcome Date GA Lbr Len/2nd Weight Sex Delivery Anes PTL Lv  6 Current           5 Term      Vag-Spont   LIV  4 Term      Vag-Spont   LIV  3 Term      Vag-Breech   LIV  2 Term      Vag-Spont   LIV  1 TAB               Past Surgical History: History reviewed. No pertinent surgical history.   Family History: History reviewed. No pertinent family history.  Social History: Social History  Substance Use Topics  . Smoking status: Current Every Day Smoker    Packs/day: 0.25    Types: Cigarettes  . Smokeless tobacco: Never Used  . Alcohol use No    Allergies:  Allergies  Allergen Reactions  . Aspirin Hives    Meds:  No prescriptions prior to admission.    I have reviewed patient's Past Medical Hx, Surgical Hx, Family Hx, Social Hx, medications and allergies.   ROS:  A comprehensive ROS was negative except per HPI.    Physical Exam  Patient Vitals for the past 24 hrs:  BP Temp Temp src Pulse Resp Height Weight  11/25/15 1723 110/60 98.8 F (37.1 C) Oral 94 16 5\' 2"  (1.575 m) 213 lb (96.6 kg)   Constitutional: Well-developed, well-nourished female in no acute  distress.  Cardiovascular: normal rate, rhythm, no murmurs Respiratory: normal effort, CTAB GI: Abd soft, non-tender, gravid appropriate for gestational age. Pos BS x 4 MS: Extremities nontender, no edema, normal ROM Neurologic: Alert and oriented x 4.  GU: Neg CVAT. SVE: Dilation: Closed Effacement (%): Thick Cervical Position: Posterior Exam by:: Dr. Omer Jack   FHT:  Baseline 135 , moderate variability, accelerations present, no decelerations Contractions: None   Labs: Results for orders placed or performed during the hospital encounter of 11/25/15 (from the past 24 hour(s))  Urinalysis, Routine w reflex microscopic (not at [facility])     Status: Abnormal   Collection Time: 11/25/15  5:10 PM  Result Value Ref Range   Color, Urine YELLOW YELLOW   APPearance CLEAR CLEAR   Specific Gravity, Urine 1.025 1.005 - 1.030   pH 6.0 5.0 - 8.0   Glucose, UA NEGATIVE NEGATIVE mg/dL   Hgb urine dipstick NEGATIVE NEGATIVE   Bilirubin Urine NEGATIVE NEGATIVE   Ketones, ur >80 (A) NEGATIVE mg/dL   Protein, ur NEGATIVE NEGATIVE mg/dL   Nitrite NEGATIVE NEGATIVE   Leukocytes, UA NEGATIVE NEGATIVE    Imaging:  No results found.  MAU Course: SVE  Wet Prep - neg NST  I personally reviewed the patient's NST today, found to be REACTIVE. 135 bpm, mod var, +accels, no decels. CTX: None.   MDM: Plan of care reviewed with patient, including labs and tests ordered and medical treatment. There is no evidence of preterm labor, reviewed with patient.   Assessment: 1. Abdominal pain in pregnancy, third trimester     Plan: Discharge home in stable condition.  Preterm Labor precautions and fetal kick counts      Medication List    You have not been prescribed any medications.     [address] Woodland [place], [place] 11/25/2015 6:15 PM

## 2015-11-25 NOTE — Discharge Instructions (Signed)

## 2016-01-18 LAB — OB RESULTS CONSOLE GBS: STREP GROUP B AG: POSITIVE

## 2016-02-12 ENCOUNTER — Inpatient Hospital Stay (HOSPITAL_COMMUNITY)
Admission: EM | Admit: 2016-02-12 | Discharge: 2016-02-14 | DRG: 775 | Disposition: A | Discharge status: Home / Self Care | Payer: Medicaid Other | Source: Ambulatory Visit | Attending: Obstetrics and Gynecology | Admitting: Obstetrics and Gynecology

## 2016-02-12 ENCOUNTER — Encounter (HOSPITAL_COMMUNITY): Payer: Self-pay | Admitting: *Deleted

## 2016-02-12 DIAGNOSIS — O4202 Full-term premature rupture of membranes, onset of labor within 24 hours of rupture: Principal | ICD-10-CM | POA: Diagnosis present

## 2016-02-12 DIAGNOSIS — Z3A39 39 weeks gestation of pregnancy: Secondary | ICD-10-CM | POA: Diagnosis not present

## 2016-02-12 DIAGNOSIS — O99824 Streptococcus B carrier state complicating childbirth: Secondary | ICD-10-CM | POA: Diagnosis present

## 2016-02-12 DIAGNOSIS — F17210 Nicotine dependence, cigarettes, uncomplicated: Secondary | ICD-10-CM | POA: Diagnosis present

## 2016-02-12 DIAGNOSIS — O99334 Smoking (tobacco) complicating childbirth: Secondary | ICD-10-CM | POA: Diagnosis present

## 2016-02-12 LAB — CBC
HCT: 37.7 % (ref 36.0–46.0)
Hemoglobin: 13.2 g/dL (ref 12.0–15.0)
MCH: 32.3 pg (ref 26.0–34.0)
MCHC: 35 g/dL (ref 30.0–36.0)
MCV: 92.2 fL (ref 78.0–100.0)
Platelets: 241 10*3/uL (ref 150–400)
RBC: 4.09 MIL/uL (ref 3.87–5.11)
RDW: 14.7 % (ref 11.5–15.5)
WBC: 6.6 10*3/uL (ref 4.0–10.5)

## 2016-02-12 LAB — AMNISURE RUPTURE OF MEMBRANE (ROM) NOT AT ARMC: AMNISURE: POSITIVE

## 2016-02-12 LAB — RPR: RPR: NONREACTIVE

## 2016-02-12 MED ORDER — DIBUCAINE 1 % RE OINT: 1.0000 "application " | TOPICAL_OINTMENT | RECTAL | Status: DC | PRN

## 2016-02-12 MED ORDER — IBUPROFEN 600 MG PO TABS
600.0000 mg | ORAL_TABLET | Freq: Four times a day (QID) | ORAL | Status: DC
  Administered 2016-02-12 – 2016-02-14 (×8): 600 mg via ORAL
  Filled 2016-02-12 (×8): qty 1

## 2016-02-12 MED ORDER — SIMETHICONE 80 MG PO CHEW: 80.0000 mg | CHEWABLE_TABLET | ORAL | Status: DC | PRN

## 2016-02-12 MED ORDER — WITCH HAZEL-GLYCERIN EX PADS: 1.0000 "application " | MEDICATED_PAD | CUTANEOUS | Status: DC | PRN

## 2016-02-12 MED ORDER — ONDANSETRON HCL 4 MG/2ML IJ SOLN: 4.0000 mg | Freq: Four times a day (QID) | INTRAMUSCULAR | Status: DC | PRN

## 2016-02-12 MED ORDER — OXYCODONE-ACETAMINOPHEN 5-325 MG PO TABS: 2.0000 | ORAL_TABLET | ORAL | Status: DC | PRN

## 2016-02-12 MED ORDER — FENTANYL CITRATE (PF) 100 MCG/2ML IJ SOLN
100.0000 ug | INTRAMUSCULAR | Status: DC | PRN
  Administered 2016-02-12 (×2): 100 ug via INTRAVENOUS
  Filled 2016-02-12 (×2): qty 2

## 2016-02-12 MED ORDER — TETANUS-DIPHTH-ACELL PERTUSSIS 5-2.5-18.5 LF-MCG/0.5 IM SUSP: 0.5000 mL | Freq: Once | INTRAMUSCULAR | Status: DC

## 2016-02-12 MED ORDER — FLEET ENEMA 7-19 GM/118ML RE ENEM: 1.0000 | ENEMA | RECTAL | Status: DC | PRN

## 2016-02-12 MED ORDER — OXYCODONE-ACETAMINOPHEN 5-325 MG PO TABS: 1.0000 | ORAL_TABLET | ORAL | Status: DC | PRN

## 2016-02-12 MED ORDER — OXYTOCIN 40 UNITS IN LACTATED RINGERS INFUSION - SIMPLE MED: 2.5000 [IU]/h | INTRAVENOUS | Status: DC

## 2016-02-12 MED ORDER — COCONUT OIL OIL: 1.0000 "application " | TOPICAL_OIL | Status: DC | PRN

## 2016-02-12 MED ORDER — PENICILLIN G POT IN DEXTROSE 60000 UNIT/ML IV SOLN
3.0000 10*6.[IU] | INTRAVENOUS | Status: DC
  Administered 2016-02-12 (×2): 3 10*6.[IU] via INTRAVENOUS
  Filled 2016-02-12 (×4): qty 50

## 2016-02-12 MED ORDER — ONDANSETRON HCL 4 MG/2ML IJ SOLN: 4.0000 mg | INTRAMUSCULAR | Status: DC | PRN

## 2016-02-12 MED ORDER — PENICILLIN G POTASSIUM 5000000 UNITS IJ SOLR
5.0000 10*6.[IU] | Freq: Once | INTRAMUSCULAR | Status: AC
  Administered 2016-02-12: 5 10*6.[IU] via INTRAVENOUS
  Filled 2016-02-12: qty 5

## 2016-02-12 MED ORDER — BENZOCAINE-MENTHOL 20-0.5 % EX AERO
1.0000 "application " | INHALATION_SPRAY | CUTANEOUS | Status: DC | PRN
  Administered 2016-02-13: 1 via TOPICAL
  Filled 2016-02-12 (×2): qty 56

## 2016-02-12 MED ORDER — ONDANSETRON HCL 4 MG PO TABS: 4.0000 mg | ORAL_TABLET | ORAL | Status: DC | PRN

## 2016-02-12 MED ORDER — ACETAMINOPHEN 325 MG PO TABS: 650.0000 mg | ORAL_TABLET | ORAL | Status: DC | PRN

## 2016-02-12 MED ORDER — PRENATAL MULTIVITAMIN CH
1.0000 | ORAL_TABLET | Freq: Every day | ORAL | Status: DC
  Administered 2016-02-13 – 2016-02-14 (×2): 1 via ORAL
  Filled 2016-02-12 (×2): qty 1

## 2016-02-12 MED ORDER — OXYTOCIN 40 UNITS IN LACTATED RINGERS INFUSION - SIMPLE MED
1.0000 m[IU]/min | INTRAVENOUS | Status: DC
  Administered 2016-02-12: 2 m[IU]/min via INTRAVENOUS
  Filled 2016-02-12: qty 1000

## 2016-02-12 MED ORDER — DIPHENHYDRAMINE HCL 25 MG PO CAPS: 25.0000 mg | ORAL_CAPSULE | Freq: Four times a day (QID) | ORAL | Status: DC | PRN

## 2016-02-12 MED ORDER — ZOLPIDEM TARTRATE 5 MG PO TABS: 5.0000 mg | ORAL_TABLET | Freq: Every evening | ORAL | Status: DC | PRN

## 2016-02-12 MED ORDER — PNEUMOCOCCAL VAC POLYVALENT 25 MCG/0.5ML IJ INJ
0.5000 mL | INJECTION | INTRAMUSCULAR | Status: DC
Start: 2016-02-13 — End: 2016-02-14
  Filled 2016-02-12: qty 0.5

## 2016-02-12 MED ORDER — LACTATED RINGERS IV SOLN
INTRAVENOUS | Status: DC
  Administered 2016-02-12 (×2): via INTRAVENOUS

## 2016-02-12 MED ORDER — SOD CITRATE-CITRIC ACID 500-334 MG/5ML PO SOLN: 30.0000 mL | ORAL | Status: DC | PRN

## 2016-02-12 MED ORDER — OXYTOCIN BOLUS FROM INFUSION
500.0000 mL | Freq: Once | INTRAVENOUS | Status: AC
  Administered 2016-02-12: 500 mL via INTRAVENOUS

## 2016-02-12 MED ORDER — TERBUTALINE SULFATE 1 MG/ML IJ SOLN
0.2500 mg | Freq: Once | INTRAMUSCULAR | Status: DC | PRN
  Filled 2016-02-12: qty 1

## 2016-02-12 MED ORDER — LACTATED RINGERS IV SOLN: 500.0000 mL | INTRAVENOUS | Status: DC | PRN

## 2016-02-12 MED ORDER — LIDOCAINE HCL (PF) 1 % IJ SOLN
30.0000 mL | INTRAMUSCULAR | Status: DC | PRN
  Filled 2016-02-12: qty 30

## 2016-02-12 MED ORDER — SENNOSIDES-DOCUSATE SODIUM 8.6-50 MG PO TABS
2.0000 | ORAL_TABLET | ORAL | Status: DC
  Administered 2016-02-13 – 2016-02-14 (×2): 2 via ORAL
  Filled 2016-02-12 (×2): qty 2

## 2016-02-12 NOTE — MAU Note (Signed)
Pt presents complaining of contractions every 7-10 minutes and possible SROM. First noticed leaking at 0230 and has pink leaking since. Reports good fetal movement.

## 2016-02-12 NOTE — Progress Notes (Deleted)
36 y.o. [id number] at 4067w5d delivered a viable female infant in cephalic, vertex position. Anterior shoulder delivered with ease. 60 sec delayed cord clamping. Cord clamped x2 and cut. Placenta delivered spontaneously intact, with 3VC. Fundus firm on exam with massage and pitocin. Good hemostasis noted.   Laceration: minor periurethral Suture: NA Good hemostasis noted: EBL 150cc  Mom and baby recovering in LDR.    Apgars: 9/9 Weight: pending  Patricia Muscaaniel L Lashaun Poch, MD PGY-1 02/12/2016, 4:54 PM

## 2016-02-12 NOTE — H&P (Signed)
LABOR AND DELIVERY ADMISSION HISTORY AND PHYSICAL NOTE  Patricia Gray is a 36 y.o. female [phone] with IUP at 264w5d by US presenting for SROM at 0230 on 02/12/16.   She reports positive fetal movement, contractions every 5-7 minutes and she denies vaginal bleeding.  Prenatal History/Complications:  GBS Pos  Past Medical History: No past medical history on file.  Past Surgical History: No past surgical history on file.  Obstetrical History: OB History    Gravida Para Term Preterm AB Living   6 4 4  0 1 4   SAB TAB Ectopic Multiple Live Births   0 1 0 0 4      Social History: Social History   Social History  . Marital status: Single    Spouse name: N/A  . Number of children: N/A  . Years of education: N/A   Social History Main Topics  . Smoking status: Current Every Day Smoker    Packs/day: 0.25    Types: Cigarettes  . Smokeless tobacco: Never Used  . Alcohol use No  . Drug use: No  . Sexual activity: Yes    Birth control/ protection: None   Other Topics Concern  . None   Social History Narrative  . None    Family History: No family history on file.  Allergies: Allergies  Allergen Reactions  . Aspirin Hives    No prescriptions prior to admission.     Review of Systems   All systems reviewed and negative except as stated in HPI  Blood pressure 109/64, pulse 75, temperature 98.6 F (37 C), temperature source Oral, resp. rate 18, height 5' 2.5" (1.588 m), weight 95.7 kg (211 lb), last menstrual period 05/24/2015. General appearance: alert and cooperative  Lungs: clear to auscultation bilaterally Heart: regular rate and rhythm Abdomen: soft, non-tender;gravid abdomen Extremities: No calf swelling or tenderness Presentation: cephalic FHR by Doppler: 151 bpm Uterine activity:  Dilation: 3 Effacement (%): 50 Station: -3 Exam by:: Dr. Genevie AnnSchenk   Prenatal labs: ABO, Rh:  O, + Antibody:   Neg Rubella: Immune RPR:   non-reactive HBsAg:    Neg HIV:   non-reactive GBS:   postivie 1 hr Glucola: 114 Genetic screening: none Anatomy US: Normal  Prenatal Transfer Tool  Maternal Diabetes: Yes:  Diabetes Type:  Insulin/Medication controlled Genetic Screening: Normal Maternal Ultrasounds/Referrals: Normal Fetal Ultrasounds or other Referrals:  None Maternal Substance Abuse:  Yes:  Type: Smoker Significant Maternal Medications:  None Significant Maternal Lab Results: Lab values include: Group B Strep positive  Results for orders placed or performed during the hospital encounter of 02/12/16 (from the past 24 hour(s))  Amnisure rupture [facility] membrane (rom)[facility]   Collection Time: 02/12/16  6:40 AM  Result Value Ref Range   Amnisure ROM POSITIVE   CBC   Collection Time: 02/12/16  8:00 AM  Result Value Ref Range   WBC 6.6 4.0 - 10.5 K/uL   RBC 4.09 3.87 - 5.11 MIL/uL   Hemoglobin 13.2 12.0 - 15.0 g/dL   HCT 45.437.7 09.836.0 - 11.946.0 %   MCV 92.2 78.0 - 100.0 fL   MCH 32.3 26.0 - 34.0 pg   MCHC 35.0 30.0 - 36.0 g/dL   RDW 14.714.7 82.911.5 - 56.215.5 %   Platelets 241 150 - 400 K/uL  Type and screen [facility]   Collection Time: 02/12/16  8:00 AM  Result Value Ref Range   ABO/RH(D) O POS    Antibody Screen PENDING    Sample  Expiration 02/15/2016     Patient Active Problem List   Diagnosis Date Noted  . Normal labor 02/12/2016    Assessment: Patricia Gray is a 36 y.o. [id number] at [redacted]w[redacted]d here for SROM at 0230 02/12/16 and reports contractions every 5-7 minutes and denies vaginal bleeding. Complications include GBS Pos status and current daily smoker of 5 cigarettes/day. Expecting a baby girl and plans to breast feed. Wants a nexplanon. Cervix dilated to 2cm upon presentation and currently at 3cm.  Received first dose of PCN and second dose to start at 0000 for GBS pos status.  Starting pit at 75milli-units/min and will increase dose after second PCN dose. Patient appearing well with no acute concerns. Will continue to  assess.   Renne Musca, MD PGY-1 02/12/2016, 11:01 AM  OB FELLOW HISTORY AND PHYSICAL ATTESTATION  I have seen and examined this patient; I agree with above documentation in the resident's note.    Ernestina Penna 02/12/2016, 11:27 AM

## 2016-02-12 NOTE — Anesthesia Pain Management Evaluation Note (Signed)
  CRNA Pain Management Visit Note  Patient: Patricia Gray, 36 y.o., female  "Hello I am a member of the anesthesia team [facility]. We have an anesthesia team available at all times to provide care throughout the hospital, including epidural management and anesthesia for C-section. I don't know your plan for the delivery whether it a natural birth, water birth, IV sedation, nitrous supplementation, doula or epidural, but we want to meet your pain goals."   1.Was your pain managed to your expectations on prior hospitalizations?   Yes   2.What is your expectation for pain management during this hospitalization?     IV pain meds  3.How can we help you reach that goal? Standby in case of emergency.  Record the patient's initial score and the patient's pain goal.   Pain: 6  Pain Goal: 8 [facility] wants you to be able to say your pain was always managed very well.  Patricia Gray 02/12/2016

## 2016-02-12 NOTE — L&D Delivery Note (Signed)
36 y.o. [id number] at 6456w5d delivered a viable female infant in cephalic, vertex position. Anterior shoulder delivered with ease. 60 sec delayed cord clamping. Cord clamped x2 and cut. Placenta delivered spontaneously intact, with 3VC. Fundus firm on exam with massage and pitocin. Good hemostasis noted.    Laceration: minor periurethral Suture: NA Good hemostasis noted: EBL 150cc  Mom and baby recovering in LDR.    Apgars: 9/9 Weight: pending  Renne Muscaaniel L Warden, MD PGY-1 02/12/2016, 4:54 PM   OB FELLOW DELIVERY ATTESTATION  I was gloved and present for the delivery in its entirety, and I agree with the above resident's note.    Ernestina PennaNicholas Krystol Rocco, MD 6:30 PM

## 2016-02-13 MED ORDER — OXYCODONE-ACETAMINOPHEN 5-325 MG PO TABS
1.0000 | ORAL_TABLET | Freq: Once | ORAL | Status: AC
  Administered 2016-02-13: 1 via ORAL
  Filled 2016-02-13: qty 1

## 2016-02-13 NOTE — Progress Notes (Signed)
POSTPARTUM PROGRESS NOTE  Post Partum Day 1 Subjective:  Patricia Gray is a 36 y.o. [id number] 8610w5d s/p vaginal delivery.  No acute events overnight.  Pt denies problems with ambulating, voiding or po intake.  She denies nausea or vomiting.  Pain is well controlled.  She has had flatus. She has not had bowel movement.  Lochia Minimal.   Objective: Blood pressure (!) 100/58, pulse 77, temperature 97.8 F (36.6 C), temperature source Oral, resp. rate 16, height 5' 2.5" (1.588 m), weight 95.7 kg (211 lb), last menstrual period 05/24/2015, unknown if currently breastfeeding. Breast feeding and took one bottle last night.   Physical Exam:  General: alert, cooperative and no distress Lochia:normal flow Chest: CTAB Heart: RRR no m/r/g Abdomen: +BS, soft, nontender,  Uterine Fundus: firm DVT Evaluation: No calf swelling or tenderness Extremities: no edema   Recent Labs  02/12/16 0800  HGB 13.2  HCT 37.7    Assessment/Plan:  ASSESSMENT: Patricia RobinsonRashenna A Yan is a [age] y.o. [id number] 4810w5d s/p vaginal delivery  Plan for discharge tomorrow   LOS: 1 day   Renne Muscaaniel L Warden, MD PGY-1 Center for [facility], [facility]  02/13/2016, 7:14 AM   OB FELLOW POSTPARTUM PROGRESS NOTE ATTESTATION  I have seen and examined this patient and agree with above documentation in the resident's note.   Patricia MowElizabeth Corine Solorio, DO OB Fellow 9:12 AM

## 2016-02-13 NOTE — Lactation Note (Signed)
This note was copied from a baby's chart. Lactation Consultation Note Mom's 5th baby. Mom BF her first child now 35 yrs old for 2 yrs w/o difficulty. Has a 35 yr old that she attempted to BF, but mom went to work after 2 weeks of having baby and it cut moms milk supply and she became sick from returning to work to soon. Mom didn't try to BF her 247 yr old and 5 yr ols. Mom  Wants to BF this baby since she hopes to be her last child.  Mom states she has only been able to latch to the Lt. Breast d/t Rt. Nipple inverted. W/stimulation, Rt. Nipple outter edges everts w/center cont. To be inverted. Lt. Nipple has outter edges everted w/inverted center. Nipples and areolas compressible, rolls nipple well w/stimulation to evert more. Hand pump to pre-pump to evert more for deeper latch. Shells given to wear in bra. Hand expression w/colostrum noted. Mom states Lt. Nipple sore. Skin intact. Encouraged to pat colostrum.  Baby isn't in rm.  Mom encouraged to feed baby 8-12 times/24 hours and with feeding cues. Educated about newborn behavior, I&O, STS, cluster feeding, supply and demand. Mom is wanting to be Breast/bottle for a while until her milk comes in. Discussed LEAD and consistency of colostrum and amount of newborn feeding needs. WH/LC brochure given w/resources, support groups and LC services.  Patient Name: Girl Rodman KeyRashenna Helm Today's Date: 02/13/2016 Reason for consult: Initial assessment   Maternal Data Has patient been taught Hand Expression?: Yes Does the patient have breastfeeding experience prior to this delivery?: Yes  Feeding Feeding Type: Breast Fed Length of feed: 15 min  LATCH Score/Interventions       Type of Nipple: Inverted (Rt. inverted/lt. everted/inverted center) Intervention(s): Shells;Hand pump  Comfort (Breast/Nipple): Filling, red/small blisters or bruises, mild/mod discomfort  Problem noted: Mild/Moderate discomfort Interventions (Mild/moderate discomfort):  Pre-pump if needed;Hand massage;Hand expression        Lactation Tools Discussed/Used WIC Program: Yes Pump Review: Setup, frequency, and cleaning;Milk Storage Initiated by:: RN Date initiated:: 02/12/16   Consult Status Consult Status: Follow-up Date: 02/13/16 (in pm) Follow-up type: In-patient    Torii Royse, Diamond NickelLAURA G 02/13/2016, 2:40 AM

## 2016-02-13 NOTE — Progress Notes (Signed)
UR chart review completed.  

## 2016-02-14 MED ORDER — IBUPROFEN 600 MG PO TABS: 600.0000 mg | ORAL_TABLET | Freq: Four times a day (QID) | ORAL | 0 refills | Status: DC

## 2016-02-14 MED ORDER — ACETAMINOPHEN 325 MG PO TABS: 650.0000 mg | ORAL_TABLET | ORAL | 0 refills | Status: DC | PRN

## 2016-02-14 NOTE — Discharge Instructions (Signed)

## 2016-02-14 NOTE — Lactation Note (Signed)
This note was copied from a baby's chart. Lactation Consultation Note  Patient Name: Girl Rodman KeyRashenna Frayne Today's Date: 02/14/2016   Visited with Mom on day of discharge, baby 1943 hrs old. Weight loss of 4.6%.  Mom with difficult latch on the right side (inverted), but states she is able to latch on the right with a nipple shield.  Talked about supply meets demand and need to stimulate her milk supply.  Mom did not like the DEBP and using the manual pump only.  Breasts are feeling heavier.  Offered an assist with latching but Mom stated that baby had just had a bottle of formula.  Baby sleeping in the bed.  Talked about [facility] and how it works, but Mom declined.  Offered OP appointment, declined.  Reminded her of OP lactation services available to her.   Encouraged STS and cue based feedings, goal of 8-12 feedings per 24 hrs.   Encouraged to call prn for concerns.    Mom to ask for assist with latch prior to discharge.    Judee ClaraSmith, Jahkeem Kurka E 02/14/2016, 12:12 PM

## 2016-02-14 NOTE — Discharge Summary (Signed)
OB Discharge Summary     Patient Name: Patricia Gray DOB: 09-Nov-1980 MRN: [id number]  Date of admission: 02/12/2016 Delivering MD: Ernestina Penna MICHAEL   Date of discharge: 02/14/2016  Admitting diagnosis: 35 wks fluid leaking Intrauterine pregnancy: [redacted]w[redacted]d     Secondary diagnosis:  Active Problems:   Normal labor   SVD  Additional problems: none     Discharge diagnosis: Term Pregnancy Delivered                                                                                                Post partum procedures:none  Augmentation: Pitocin  Complications: None  Hospital course:  Onset of Labor With Vaginal Delivery     36 y.o. yo [id number] at [redacted]w[redacted]d was admitted in Latent Labor on 02/12/2016. Patient had an uncomplicated labor course as follows:  Membrane Rupture Time/Date: 2:30 AM ,02/12/2016   Intrapartum Procedures: Episiotomy: None [1]                                         Lacerations:  Periurethral [8]  Patient had a delivery of a Viable infant. 02/12/2016  Information for the patient's newborn:  Patricia Gray [[id number]]  Delivery Method: Vag-Spont    [name] had an uncomplicated postpartum course.  She is ambulating, tolerating a regular diet, passing flatus, and urinating well. Patient is discharged home in stable condition on 02/14/16.    Physical exam Vitals:   02/13/16 0507 02/13/16 0745 02/13/16 1809 02/14/16 0607  BP: (!) 100/58 106/62 119/66 (!) 118/59  Pulse: 77 80 84 66  Resp: 16 16 18 18   Temp: 97.8 F (36.6 C)   97.9 F (36.6 C)  TempSrc: Oral   Oral  SpO2:  99%    Weight:      Height:       General: alert, cooperative and no distress Lochia: appropriate Uterine Fundus: firm Incision: N/A DVT Evaluation: No evidence of DVT seen on physical exam. No significant calf/ankle edema. Labs: Lab Results  Component Value Date   WBC 6.6 02/12/2016   HGB 13.2 02/12/2016   HCT 37.7 02/12/2016   MCV 92.2 02/12/2016   PLT 241  02/12/2016   CMP Latest Ref Rng & Units 08/12/2015  Glucose 65 - 99 mg/dL 89  BUN 6 - 20 mg/dL 6  Creatinine 1.30 - 8.65 mg/dL 7.84  Sodium 696 - 295 mmol/L 136  Potassium 3.5 - 5.1 mmol/L 3.5  Chloride 101 - 111 mmol/L 110  CO2 22 - 32 mmol/L 19(L)  Calcium 8.9 - 10.3 mg/dL 2.8(U)  Total Protein 6.5 - 8.1 g/dL 5.9(L)  Total Bilirubin 0.3 - 1.2 mg/dL 0.6  Alkaline Phos 38 - 126 U/L 30(L)  AST 15 - 41 U/L 18  ALT 14 - 54 U/L 17    Discharge instruction: per After Visit Summary and "Baby and Me Booklet".  After visit meds:  Allergies as of 02/14/2016      Reactions   Aspirin Hives   Tolerates ibuprofen  per patient      Medication List    TAKE these medications   acetaminophen 325 MG tablet Commonly known as:  TYLENOL Take 2 tablets (650 mg total) by mouth every 4 (four) hours as needed (for pain scale < 4).   ibuprofen 600 MG tablet Commonly known as:  ADVIL,MOTRIN Take 1 tablet (600 mg total) by mouth every 6 (six) hours.       Diet: routine diet  Activity: Advance as tolerated. Pelvic rest for 6 weeks.   Outpatient follow up:6 weeks Follow up Appt:No future appointments. Follow up Visit:No Follow-up on file.  Postpartum contraception: Nexplanon  Newborn Data: Live born female  Birth Weight: 7 lb 3.2 oz (3265 g) APGAR: 9, 9  Baby Feeding: Breast Disposition:home with mother   02/14/2016 Ernestina PennaNicholas Schenk, MD

## 2016-02-14 NOTE — Progress Notes (Signed)
POSTPARTUM PROGRESS NOTE  Post Partum Day 2  Subjective:  Patricia Gray is a 36 y.o. [id number] 273w5d s/p SVD.  No acute events overnight.  Pt denies problems with ambulating, voiding or po intake.  She denies nausea or vomiting.  Pain is well controlled.  She has had flatus. She has had bowel movement.  Lochia normal.   Objective: Blood pressure (!) 118/59, pulse 66, temperature 97.9 F (36.6 C), temperature source Oral, resp. rate 18, height 5' 2.5" (1.588 m), weight 95.7 kg (211 lb), last menstrual period 05/24/2015, SpO2 99 %, trying to breastfeed  Physical Exam:  General: alert, cooperative and no distress Lochia: normal flow Chest: nl WOB on RA Heart: RRR no m/r/g Abdomen: soft, mildly tender Uterine Fundus: firm DVT Evaluation: No calf swelling or tenderness Extremities: no edema   Recent Labs  02/12/16 0800  HGB 13.2  HCT 37.7    Assessment/Plan:  ASSESSMENT: Patricia Gray is a 36 y.o. [id number] 6073w5d PPD 2 s/p SVD. Planning for nexplanon contraception and breastfeeding but expresses concern about low milk supply; baby is "fussy" (mom mentioned smoking during pregnancy) but doing well. Plan for discharge today.   LOS: 2 days   Ennis FortsJessica Sibley, Medical Student [facility], [facility]  02/14/2016, 7:13 AM   OB FELLOW MEDICAL STUDENT NOTE ATTESTATION  I have seen and examined this patient. Note this is a Psychologist, occupationalmedical student note and as such does not necessarily reflect the patient's plan of care. Please see Ernestina Pennaicholas Schenk MD's note for this date of service.    Ernestina Pennaicholas Schenk 02/14/2016, 11:25 AM

## 2016-02-16 LAB — TYPE AND SCREEN
ABO/RH(D): O POS
ANTIBODY SCREEN: POSITIVE
DAT, IgG: NEGATIVE
PT AG Type: NEGATIVE
UNIT DIVISION: 0
Unit division: 0

## 2016-02-19 ENCOUNTER — Encounter: Payer: Self-pay | Admitting: Obstetrics and Gynecology

## 2016-02-19 DIAGNOSIS — R760 Raised antibody titer: Secondary | ICD-10-CM | POA: Insufficient documentation

## 2016-02-19 HISTORY — DX: Raised antibody titer: R76.0

## 2019-03-03 ENCOUNTER — Encounter (HOSPITAL_BASED_OUTPATIENT_CLINIC_OR_DEPARTMENT_OTHER): Payer: Self-pay | Admitting: *Deleted

## 2019-03-03 ENCOUNTER — Other Ambulatory Visit: Payer: Self-pay

## 2019-03-03 ENCOUNTER — Emergency Department (HOSPITAL_BASED_OUTPATIENT_CLINIC_OR_DEPARTMENT_OTHER): Payer: Medicaid Other

## 2019-03-03 ENCOUNTER — Emergency Department (HOSPITAL_BASED_OUTPATIENT_CLINIC_OR_DEPARTMENT_OTHER)
Admission: EM | Admit: 2019-03-03 | Discharge: 2019-03-03 | Disposition: A | Discharge status: Home / Self Care | Payer: Medicaid Other | Attending: Emergency Medicine | Admitting: Emergency Medicine

## 2019-03-03 DIAGNOSIS — F17210 Nicotine dependence, cigarettes, uncomplicated: Secondary | ICD-10-CM | POA: Diagnosis not present

## 2019-03-03 DIAGNOSIS — M79672 Pain in left foot: Secondary | ICD-10-CM | POA: Insufficient documentation

## 2019-03-03 MED ORDER — NAPROXEN 500 MG PO TABS: 500.0000 mg | ORAL_TABLET | Freq: Two times a day (BID) | ORAL | 0 refills | Status: DC | PRN

## 2019-03-03 NOTE — ED Provider Notes (Signed)
[facility] EMERGENCY DEPARTMENT Provider Note   CSN: [id number] Arrival date & time: 03/03/19  1754     History Chief Complaint  Patient presents with  . Foot Pain    Patricia Gray is a 39 y.o. female.  Patient presents with left-sided foot pain that onset 3 nights ago.  States she developed severe pain in the fourth and fifth MTP area of her left foot after working 16-hour shift.  She works in a [employer].  The pain improves when she elevates her foot and rests it.  She describes severe pain to the lateral side of her left foot that prevents her from walking.  There is no other joint pain.  No fever or vomiting.  No numbness or tingling.  Denies any injury.  She has been taking Motrin without relief.  The history is provided by the patient.  Foot Pain Pertinent negatives include no chest pain, no abdominal pain, no headaches and no shortness of breath.       Past Medical History:  Diagnosis Date  . ANTI LEA Melvyn Neth a)  02/19/2016   O POS     Patient Active Problem List   Diagnosis Date Noted  . ANTI LEA Melvyn Neth a)  02/19/2016  . Normal labor 02/12/2016    History reviewed. No pertinent surgical history.   OB History    Gravida  6   Para  5   Term  5   Preterm  0   AB  1   Living  5     SAB  0   TAB  1   Ectopic  0   Multiple  0   Live Births  5           No family history on file.  Social History   Tobacco Use  . Smoking status: Current Every Day Smoker    Packs/day: 0.25    Types: Cigarettes  . Smokeless tobacco: Never Used  Substance Use Topics  . Alcohol use: No  . Drug use: No    Home Medications Prior to Admission medications   Medication Sig Start Date End Date Taking? Authorizing Provider  acetaminophen (TYLENOL) 325 MG tablet Take 2 tablets (650 mg total) by mouth every 4 (four) hours as needed (for pain scale < 4). 02/14/16   Lorne Skeens, MD  ibuprofen (ADVIL,MOTRIN) 600 MG tablet Take 1 tablet  (600 mg total) by mouth every 6 (six) hours. 02/14/16   Lorne Skeens, MD    Allergies    Aspirin  Review of Systems   Review of Systems  Constitutional: Negative for activity change, appetite change and fever.  HENT: Negative for congestion and rhinorrhea.   Eyes: Negative for visual disturbance.  Respiratory: Negative for cough, chest tightness and shortness of breath.   Cardiovascular: Negative for chest pain.  Gastrointestinal: Negative for abdominal pain, nausea and vomiting.  Genitourinary: Negative for dysuria and hematuria.  Musculoskeletal: Positive for arthralgias and myalgias.  Skin: Negative for rash.  Neurological: Negative for dizziness, weakness and headaches.   all other systems are negative except as noted in the HPI and PMH.    Physical Exam Updated Vital Signs BP 121/75   Pulse 83   Temp 98.8 F (37.1 C)   Resp 16   LMP 02/25/2019   SpO2 100%   Physical Exam Vitals and nursing note reviewed.  Constitutional:      General: She is not in acute distress.    Appearance: She  is well-developed.  HENT:     Head: Normocephalic and atraumatic.     Mouth/Throat:     Pharynx: No oropharyngeal exudate.  Eyes:     Conjunctiva/sclera: Conjunctivae normal.     Pupils: Pupils are equal, round, and reactive to light.  Neck:     Comments: No meningismus. Cardiovascular:     Rate and Rhythm: Normal rate and regular rhythm.     Heart sounds: Normal heart sounds. No murmur.  Pulmonary:     Effort: Pulmonary effort is normal. No respiratory distress.     Breath sounds: Normal breath sounds.  Abdominal:     Palpations: Abdomen is soft.     Tenderness: There is no abdominal tenderness. There is no guarding or rebound.  Musculoskeletal:        General: Tenderness present. Normal range of motion.     Cervical back: Normal range of motion and neck supple.     Left lower leg: No edema.     Comments: Minus palpation of the fourth and fifth MTP joints of the  left foot.  There is no overlying erythema or edema.  Intact DP and PT pulse. No pain at base of fifth metatarsal. Full range of motion of ankle and knee on the left.   Skin:    General: Skin is warm.  Neurological:     Mental Status: She is alert and oriented to person, place, and time.     Cranial Nerves: No cranial nerve deficit.     Motor: No abnormal muscle tone.     Coordination: Coordination normal.     Comments:  5/5 strength throughout. CN 2-12 intact.Equal grip strength.   Psychiatric:        Behavior: Behavior normal.     ED Results / Procedures / Treatments   Labs (all labs ordered are listed, but only abnormal results are displayed) Labs Reviewed - No data to display  EKG None  Radiology No results found.  Procedures Procedures (including critical care time)  Medications Ordered in ED Medications - No data to display  ED Course  I have reviewed the triage vital signs and the nursing notes.  Pertinent labs & imaging results that were available during my care of the patient were reviewed by me and considered in my medical decision making (see chart for details).    MDM Rules/Calculators/A&P                      Atraumatic left foot pain after prolonged standing.  X-ray be obtained to evaluate for stress fracture.  She is neurovascular intact.  X-rays negative.  Patient given a hard soled shoe, crutches, NSAIDs.  She is given a note for work.  Follow-up with sports medicine.  Discussed that she needs to try to limit time on her feet as much as possible.  Use ice, elevation, rice. Return precautions discussed. Final Clinical Impression(s) / ED Diagnoses Final diagnoses:  Foot pain, left    Rx / DC Orders ED Discharge Orders    None       Merville Hijazi, Annie Main, MD 03/03/19 2056

## 2019-03-03 NOTE — ED Triage Notes (Signed)
Pt c/o left foot pain w/o injury x 3 days

## 2019-03-03 NOTE — ED Notes (Signed)
Pt transported to xray 

## 2019-03-03 NOTE — Discharge Instructions (Signed)
Keep your foot elevated and use ice when you are not working.  Take the pain medication as prescribed.  Wear the hard soled shoe will.  You should try to limit your time on your feet as much as possible.  Return to the ED with new or worsening symptoms.

## 2019-03-03 NOTE — ED Notes (Signed)
ED Provider at bedside. 

## 2019-06-11 ENCOUNTER — Ambulatory Visit (HOSPITAL_COMMUNITY)
Admission: EM | Admit: 2019-06-11 | Discharge: 2019-06-11 | Disposition: A | Discharge status: Home / Self Care | Payer: Medicaid Other | Attending: Family Medicine | Admitting: Family Medicine

## 2019-06-11 ENCOUNTER — Encounter (HOSPITAL_COMMUNITY): Payer: Self-pay

## 2019-06-11 ENCOUNTER — Other Ambulatory Visit: Payer: Self-pay

## 2019-06-11 DIAGNOSIS — R6889 Other general symptoms and signs: Secondary | ICD-10-CM

## 2019-06-11 DIAGNOSIS — K2100 Gastro-esophageal reflux disease with esophagitis, without bleeding: Secondary | ICD-10-CM | POA: Diagnosis not present

## 2019-06-11 MED ORDER — FAMOTIDINE 40 MG PO TABS: 40.0000 mg | ORAL_TABLET | Freq: Two times a day (BID) | ORAL | 1 refills | Status: DC | PRN

## 2019-06-11 MED ORDER — SUCRALFATE 1 GM/10ML PO SUSP: 1.0000 g | Freq: Two times a day (BID) | ORAL | 0 refills | Status: DC

## 2019-06-11 NOTE — ED Triage Notes (Addendum)
Pt states whenever her mask is off, it feels like something is stuck in her throat. Pt states as long as her mask is on, she doesn't have that feeling. Pt states since she's had her mask on for a while now, her throat feels fine. Pt has non labored resps. Pt able to speak in complete sentences.

## 2019-06-11 NOTE — ED Provider Notes (Signed)
[facility]    CSN: [id number] Arrival date & time: 06/11/19  1204      History   Chief Complaint Chief Complaint  Patient presents with  . swollen throat    HPI Patricia Gray is a 39 y.o. female.   HPI  Patient with a history significant for acid reflux, current daily smoker presents with a sensation of right-sided fullness and tightness occurring intermittently.  Patient notes she has written recently has been recurrent flares of acid reflux.  Although she reports in the past acid reflux symptoms has not been daily or persistent.  She has never tried any medication for acid reflux.  She is unable to associate her current symptoms of throat irritation with acid reflux.  She also was concerned that symptoms may be related to outdoor allergies.  She denies any cough, persistent nasal drainage, or headache.  She has not continue medication for her symptoms.  Denies dysphagia or N&V. Endorses regular bowel movements and normal appetite.   Past Medical History:  Diagnosis Date  . ANTI LEA Melvyn Neth a)  02/19/2016   O POS     Patient Active Problem List   Diagnosis Date Noted  . ANTI LEA Melvyn Neth a)  02/19/2016  . Normal labor 02/12/2016    History reviewed. No pertinent surgical history.  OB History    Gravida  6   Para  5   Term  5   Preterm  0   AB  1   Living  5     SAB  0   TAB  1   Ectopic  0   Multiple  0   Live Births  5            Home Medications    Prior to Admission medications   Medication Sig Start Date End Date Taking? Authorizing Provider  acetaminophen (TYLENOL) 325 MG tablet Take 2 tablets (650 mg total) by mouth every 4 (four) hours as needed (for pain scale < 4). 02/14/16   Lorne Skeens, MD  ibuprofen (ADVIL,MOTRIN) 600 MG tablet Take 1 tablet (600 mg total) by mouth every 6 (six) hours. 02/14/16   Lorne Skeens, MD  naproxen (NAPROSYN) 500 MG tablet Take 1 tablet (500 mg total) by mouth 2 (two) times  daily as needed. 03/03/19   Glynn Octave, MD    Family History Family History  Problem Relation Age of Onset  . Hypertension Mother   . Cancer Mother   . Hypertension Sister     Social History Social History   Tobacco Use  . Smoking status: Current Every Day Smoker    Packs/day: 0.25    Types: Cigarettes  . Smokeless tobacco: Never Used  Substance Use Topics  . Alcohol use: Yes    Alcohol/week: 6.0 standard drinks    Types: 6 Glasses of wine per week  . Drug use: No     Allergies   Aspirin   Review of Systems Review of Systems Pertinent negatives listed in HPI  Physical Exam Triage Vital Signs ED Triage Vitals  Enc Vitals Group     BP 06/11/19 1258 121/68     Pulse Rate 06/11/19 1258 67     Resp 06/11/19 1258 16     Temp 06/11/19 1258 98.6 F (37 C)     Temp Source 06/11/19 1258 Oral     SpO2 06/11/19 1258 97 %     Weight 06/11/19 1259 208 lb (94.3 kg)  Height 06/11/19 1259 5\' 3"  (1.6 m)     Head Circumference --      Peak Flow --      Pain Score 06/11/19 1259 0     Pain Loc --      Pain Edu? --      Excl. in [place]? --    No data found.  Updated Vital Signs BP 121/68   Pulse 67   Temp 98.6 F (37 C) (Oral)   Resp 16   Ht 5\' 3"  (1.6 m)   Wt 208 lb (94.3 kg)   SpO2 97%   BMI 36.85 kg/m   Visual Acuity Right Eye Distance:   Left Eye Distance:   Bilateral Distance:    Right Eye Near:   Left Eye Near:    Bilateral Near:     Physical Exam General appearance: alert, well developed, well nourished, cooperative and in no distress Head: Normocephalic, without obvious abnormality, atraumatic Nose: Nares patient without mucosal edema Throat: Oropharynx clear,moist, and normal. Respiratory: Respirations even and unlabored, normal respiratory rate Heart: rate and rhythm normal. No gallop or murmurs noted on exam  Abdomen: BS +, no distention, no rebound tenderness, or no mass Extremities: No gross deformities Skin: Skin color, texture,  turgor normal. No rashes seen  Psych: Appropriate mood and affect. Neurologic: Alert, oriented to person, place, and time, thought content appropriate. UC Treatments / Results  Labs (all labs ordered are listed, but only abnormal results are displayed) Labs Reviewed - No data to display  EKG   Radiology No results found.  Procedures Procedures (including critical care time)  Medications Ordered in UC Medications - No data to display  Initial Impression / Assessment and Plan / UC Course  I have reviewed the triage vital signs and the nursing notes.  Pertinent labs & imaging results that were available during my care of the patient were reviewed by me and considered in my medical decision making (see chart for details).    1. Gastroesophageal reflux disease with esophagitis without hemorrhage -Famotidine 40 mg up to twice daily before meals as needed and take if you eat foods known to precipitate symptoms.  2. Throat symptom, suspect related to acid reflux esophagitis symptoms -Trial Carafate twice daily prior to meals then PRN as needed   Return for follow-up if symptoms worsen or do not improve.  Final Clinical Impressions(s) / UC Diagnoses   Final diagnoses:  Gastroesophageal reflux disease with esophagitis without hemorrhage  Throat symptom   Discharge Instructions   None    ED Prescriptions    Medication Sig Dispense Auth. Provider   sucralfate (CARAFATE) 1 GM/10ML suspension Take 10 mLs (1 g total) by mouth 2 (two) times daily before a meal. 420 mL Scot Jun, FNP   famotidine (PEPCID) 40 MG tablet  (Status: Discontinued) Take 1 tablet (40 mg total) by mouth 3 times/day as needed-between meals & bedtime for heartburn or indigestion (esophagitis). 60 tablet Scot Jun, FNP   famotidine (PEPCID) 40 MG tablet Take 1 tablet (40 mg total) by mouth 2 (two) times daily between meals as needed for heartburn or indigestion (esophagitis). 60 tablet Scot Jun, FNP     PDMP not reviewed this encounter.   Scot Jun, [place] 06/12/19 [phone]

## 2020-12-14 ENCOUNTER — Emergency Department (HOSPITAL_BASED_OUTPATIENT_CLINIC_OR_DEPARTMENT_OTHER): Payer: Medicaid Other

## 2020-12-14 ENCOUNTER — Other Ambulatory Visit: Payer: Self-pay

## 2020-12-14 ENCOUNTER — Emergency Department (HOSPITAL_BASED_OUTPATIENT_CLINIC_OR_DEPARTMENT_OTHER)
Admission: EM | Admit: 2020-12-14 | Discharge: 2020-12-15 | Disposition: A | Discharge status: Home / Self Care | Payer: Medicaid Other | Attending: Emergency Medicine | Admitting: Emergency Medicine

## 2020-12-14 ENCOUNTER — Encounter (HOSPITAL_BASED_OUTPATIENT_CLINIC_OR_DEPARTMENT_OTHER): Payer: Self-pay

## 2020-12-14 DIAGNOSIS — F17210 Nicotine dependence, cigarettes, uncomplicated: Secondary | ICD-10-CM | POA: Diagnosis not present

## 2020-12-14 DIAGNOSIS — Z20822 Contact with and (suspected) exposure to covid-19: Secondary | ICD-10-CM | POA: Diagnosis not present

## 2020-12-14 DIAGNOSIS — R059 Cough, unspecified: Secondary | ICD-10-CM | POA: Diagnosis present

## 2020-12-14 DIAGNOSIS — R053 Chronic cough: Secondary | ICD-10-CM | POA: Diagnosis not present

## 2020-12-14 LAB — RESP PANEL BY RT-PCR (FLU A&B, COVID) ARPGX2
Influenza A by PCR: NEGATIVE
Influenza B by PCR: NEGATIVE
SARS Coronavirus 2 by RT PCR: NEGATIVE

## 2020-12-14 MED ORDER — ALBUTEROL SULFATE HFA 108 (90 BASE) MCG/ACT IN AERS
2.0000 | INHALATION_SPRAY | RESPIRATORY_TRACT | Status: DC | PRN
Start: 2020-12-14 — End: 2020-12-15
  Administered 2020-12-14: 2 via RESPIRATORY_TRACT
  Filled 2020-12-14: qty 6.7

## 2020-12-14 MED ORDER — FLUTICASONE PROPIONATE HFA 44 MCG/ACT IN AERO
2.0000 | INHALATION_SPRAY | Freq: Two times a day (BID) | RESPIRATORY_TRACT | Status: DC
  Administered 2020-12-14: 2 via RESPIRATORY_TRACT
  Filled 2020-12-14: qty 10.6

## 2020-12-14 NOTE — ED Triage Notes (Signed)
Pt c/o intermittent flu like sx "since the middle of September"-has not sought medical treatment-NAD-steady gait

## 2020-12-14 NOTE — ED Provider Notes (Signed)
[facility] Provider Note: Patricia Dell, MD, FACEP  CSN: [id number] MRN: [id number] ARRIVAL: 12/14/20 at 1906 ROOM: [facility]07/[facility]07   CHIEF COMPLAINT  Cough   HISTORY OF PRESENT ILLNESS  12/14/20 11:17 PM Patricia Gray is a 40 y.o. female who had a flulike illness in mid September of this year.  She tested negative for COVID but did not have a flu test.  Since then she has had a persistent cough despite resolution of her other symptoms.  The cough is paroxysmal at times and she has had paroxysms severe enough to last for 15 minutes.  She has had a waxing and waning course but the symptoms worsened over the last week.  She is not having a fever, nasal congestion, sore throat or other acute symptoms.  She is having soreness in her chest when she coughs.  She describes the cough is productive of yellowish but not green sputum.   Past Medical History:  Diagnosis Date   ANTI LEA Melvyn Neth a)  02/19/2016   O POS     History reviewed. No pertinent surgical history.  Family History  Problem Relation Age of Onset   Hypertension Mother    Cancer Mother    Hypertension Sister     Social History   Tobacco Use   Smoking status: Every Day    Packs/day: 0.25    Types: Cigarettes   Smokeless tobacco: Never  Vaping Use   Vaping Use: Never used  Substance Use Topics   Alcohol use: Yes    Alcohol/week: 6.0 standard drinks    Types: 6 Glasses of wine per week    Comment: weekly   Drug use: No    Prior to Admission medications   Not on File    Allergies Aspirin   REVIEW OF SYSTEMS  Negative except as noted here or in the History of Present Illness.   PHYSICAL EXAMINATION  Initial Vital Signs Blood pressure 124/69, pulse 81, temperature 98.7 F (37.1 C), temperature source Oral, resp. rate 20, height 5\' 2"  (1.575 m), weight 101.5 kg, last menstrual period 11/30/2020, SpO2 99 %, unknown if currently breastfeeding.  Examination General: Well-developed,  well-nourished female in no acute distress; appearance consistent with age of record HENT: normocephalic; atraumatic Eyes: Normal appearance Neck: supple Heart: regular rate and rhythm Lungs: clear to auscultation bilaterally Abdomen: soft; nondistended; nontender; bowel sounds present Extremities: No deformity; full range of motion; pulses normal Neurologic: Awake, alert and oriented; motor function intact in all extremities and symmetric; no facial droop Skin: Warm and dry Psychiatric: Normal mood and affect   RESULTS  Summary of this visit's results, reviewed and interpreted by myself:   EKG Interpretation  Date/Time:    Ventricular Rate:    PR Interval:    QRS Duration:   QT Interval:    QTC Calculation:   R Axis:     Text Interpretation:         Laboratory Studies: Results for orders placed or performed during the hospital encounter of 12/14/20 (from the past 24 hour(s))  Resp Panel by RT-PCR (Flu A&B, Covid) Nasopharyngeal Swab     Status: None   Collection Time: 12/14/20  7:20 PM   Specimen: Nasopharyngeal Swab; Nasopharyngeal(NP) swabs in vial transport medium  Result Value Ref Range   SARS Coronavirus 2 by RT PCR NEGATIVE NEGATIVE   Influenza A by PCR NEGATIVE NEGATIVE   Influenza B by PCR NEGATIVE NEGATIVE   Imaging Studies: DG Chest 2 View  Result  Date: 12/14/2020 CLINICAL DATA:  Cough EXAM: CHEST - 2 VIEW COMPARISON:  None. FINDINGS: Heart and mediastinal contours are within normal limits. No focal opacities or effusions. No acute bony abnormality. IMPRESSION: No active cardiopulmonary disease. Electronically Signed   By: Charlett Nose M.D.   On: 12/14/2020 23:13    ED COURSE and MDM  Nursing notes, initial and subsequent vitals signs, including pulse oximetry, reviewed and interpreted by myself.  Vitals:   12/14/20 1918 12/14/20 1920 12/14/20 1942 12/14/20 2109  BP: 124/61   124/69  Pulse: 87  84 81  Resp: 20  12 20   Temp: 98.2 F (36.8 C)   98.7 F  (37.1 C)  TempSrc: Oral   Oral  SpO2: 98%  96% 99%  Weight:  101.5 kg    Height:  5\' 2"  (1.575 m)     Medications  albuterol (VENTOLIN HFA) 108 (90 Base) MCG/ACT inhaler 2 puff (has no administration in time range)  fluticasone (FLOVENT HFA) 44 MCG/ACT inhaler 2 puff (has no administration in time range)    I suspect the patient has had persistent inflammation of the bronchi following a viral illness in September.  This has been seen in other patients recently as well.  I believe she would benefit from an albuterol inhaler as well as steroid inhaler.  PROCEDURES  Procedures   ED DIAGNOSES     ICD-10-CM   1. Persistent cough for 3 weeks or longer  R05.3          Iyanna Drummer, MD 12/14/20 2327

## 2020-12-14 NOTE — ED Notes (Signed)
Per reg pt requests to be checked again-pt NAD-steady gait-VSS-aware of cont'd waiting to see EDP/PA

## 2021-05-01 ENCOUNTER — Ambulatory Visit (INDEPENDENT_AMBULATORY_CARE_PROVIDER_SITE_OTHER): Payer: Medicaid Other

## 2021-05-01 ENCOUNTER — Ambulatory Visit
Admission: RE | Admit: 2021-05-01 | Discharge: 2021-05-01 | Disposition: A | Discharge status: Home / Self Care | Payer: Medicaid Other | Source: Ambulatory Visit | Attending: Internal Medicine | Admitting: Internal Medicine

## 2021-05-01 VITALS — BP 119/76 | HR 81 | Temp 98.6°F | Resp 18 | Ht 62.0 in | Wt 215.0 lb

## 2021-05-01 DIAGNOSIS — W19XXXA Unspecified fall, initial encounter: Secondary | ICD-10-CM | POA: Diagnosis not present

## 2021-05-01 DIAGNOSIS — S50312A Abrasion of left elbow, initial encounter: Secondary | ICD-10-CM | POA: Diagnosis not present

## 2021-05-01 DIAGNOSIS — M25522 Pain in left elbow: Secondary | ICD-10-CM | POA: Diagnosis not present

## 2021-05-01 NOTE — ED Provider Notes (Signed)
?[facility] ? ? ? ?CSN: [id number] ?Arrival date & time: 05/01/21  1157 ? ? ?  ? ?History   ?Chief Complaint ?Chief Complaint  ?Patient presents with  ? Fall  ?  Elbow pain, I believe it's sprained from a fall I had. - Entered by patient  ? Appointment  ? ? ?HPI ?Hubbard RobinsonRashenna A Thedford is a 41 y.o. female.  ? ?Patient presents with left eye pain that started yesterday after a fall.  Patient reports that she works for [employer] and was carrying food up a driveway when she slipped and fell and landed directly on her left elbow.  Hand was not outstretched.  Having tenderness and abrasion to left elbow.  Last tetanus vaccine was 2 years ago.  Patient has not taken any medications to help alleviate symptoms.  Patient reports that she washed the abrasion with hydrogen peroxide. ? ? ?Fall ? ? ?Past Medical History:  ?Diagnosis Date  ? ANTI LEA Melvyn Neth([place] a)  02/19/2016  ? O POS   ? ? ?Patient Active Problem List  ? Diagnosis Date Noted  ? ANTI LEA Melvyn Neth([place] a)  02/19/2016  ? Normal labor 02/12/2016  ? ? ?History reviewed. No pertinent surgical history. ? ?OB History   ? ? Gravida  ?6  ? Para  ?5  ? Term  ?5  ? Preterm  ?0  ? AB  ?1  ? Living  ?5  ?  ? ? SAB  ?0  ? IAB  ?1  ? Ectopic  ?0  ? Multiple  ?0  ? Live Births  ?5  ?   ?  ?  ? ? ? ?Home Medications   ? ?Prior to Admission medications   ?Not on File  ? ? ?Family History ?Family History  ?Problem Relation Age of Onset  ? Hypertension Mother   ? Cancer Mother   ? Hypertension Sister   ? ? ?Social History ?Social History  ? ?Tobacco Use  ? Smoking status: Every Day  ?  Packs/day: 0.25  ?  Types: Cigarettes  ? Smokeless tobacco: Never  ?Vaping Use  ? Vaping Use: Never used  ?Substance Use Topics  ? Alcohol use: Yes  ?  Alcohol/week: 6.0 standard drinks  ?  Types: 6 Glasses of wine per week  ?  Comment: weekly  ? Drug use: No  ? ? ? ?Allergies   ?Aspirin ? ? ?Review of Systems ?Review of Systems ?Per HPI ? ?Physical Exam ?Triage Vital Signs ?ED Triage Vitals [05/01/21  1220]  ?Enc Vitals Group  ?   BP 119/76  ?   Pulse Rate 81  ?   Resp 18  ?   Temp 98.6 ?F (37 ?C)  ?   Temp Source Oral  ?   SpO2 96 %  ?   Weight 215 lb (97.5 kg)  ?   Height 5\' 2"  (1.575 m)  ?   Head Circumference   ?   Peak Flow   ?   Pain Score 1  ?   Pain Loc   ?   Pain Edu?   ?   Excl. in [place]?   ? ?No data found. ? ?Updated Vital Signs ?BP 119/76 (BP Location: Left Arm)   Pulse 81   Temp 98.6 ?F (37 ?C) (Oral)   Resp 18   Ht 5\' 2"  (1.575 m)   Wt 215 lb (97.5 kg)   LMP 04/24/2021   SpO2 96%   Breastfeeding No  BMI 39.32 kg/m?  ? ?Visual Acuity ?Right Eye Distance:   ?Left Eye Distance:   ?Bilateral Distance:   ? ?Right Eye Near:   ?Left Eye Near:    ?Bilateral Near:    ? ?Physical Exam ?Constitutional:   ?   General: She is not in acute distress. ?   Appearance: Normal appearance. She is not toxic-appearing or diaphoretic.  ?HENT:  ?   Head: Normocephalic and atraumatic.  ?Eyes:  ?   Extraocular Movements: Extraocular movements intact.  ?   Conjunctiva/sclera: Conjunctivae normal.  ?Pulmonary:  ?   Effort: Pulmonary effort is normal.  ?Musculoskeletal:  ?   Comments: Tenderness to palpation with mild erythema and swelling noted to left posterior elbow.  Patient has full range of motion of left elbow is able to extend, flex, supinate, and pronate.  Grip strength 5/5.  Neurovascular intact.  ?Skin: ?   Comments: Very small superficial abrasion located to left posterior elbow.  No bleeding noted.  No signs of infection.  ?Neurological:  ?   General: No focal deficit present.  ?   Mental Status: She is alert and oriented to person, place, and time. Mental status is at baseline.  ?Psychiatric:     ?   Mood and Affect: Mood normal.     ?   Behavior: Behavior normal.     ?   Thought Content: Thought content normal.     ?   Judgment: Judgment normal.  ? ? ? ?UC Treatments / Results  ?Labs ?(all labs ordered are listed, but only abnormal results are displayed) ?Labs Reviewed - No data to  display ? ?EKG ? ? ?Radiology ?DG Elbow Complete Left ? ?Result Date: 05/01/2021 ?CLINICAL DATA:  Fall yesterday with left elbow injury. EXAM: LEFT ELBOW - COMPLETE 3+ VIEW COMPARISON:  None. FINDINGS: There is no evidence of fracture, dislocation, or joint effusion. There is no evidence of arthropathy or other focal bone abnormality. Soft tissues are unremarkable. IMPRESSION: Negative. Electronically Signed   By: Aletta Edouard M.D.   On: 05/01/2021 13:01   ? ?Procedures ?Procedures (including critical care time) ? ?Medications Ordered in UC ?Medications - No data to display ? ?Initial Impression / Assessment and Plan / UC Course  ?I have reviewed the triage vital signs and the nursing notes. ? ?Pertinent labs & imaging results that were available during my care of the patient were reviewed by me and considered in my medical decision making (see chart for details). ? ?  ? ?Left elbow x-ray was negative for any acute bony abnormality.  It appears that patient has Nexplanon birth control in arm and patient confirmed this.  Suspect contusion to left elbow due to impact injury.  Wound was cleansed and a dressing was applied in urgent care today.  Patient advised to keep clean and to monitor for signs of infection.  Patient to follow-up if infection occurs.  Discussed supportive care and ice application with patient.  Discussed return precautions.  Patient verbalized understanding and was agreeable with plan.  Tetanus is up-to-date. ?Final Clinical Impressions(s) / UC Diagnoses  ? ?Final diagnoses:  ?Fall, initial encounter  ?Left elbow pain  ?Abrasion of left elbow, initial encounter  ? ? ? ?Discharge Instructions   ? ?  ?Your x-ray was negative.  Please change dressing daily.  Follow-up if infection signs to your left elbow occur that include increased redness, swelling, pus. ? ? ? ? ?ED Prescriptions   ?None ?  ? ?PDMP not  reviewed this encounter. ?  ?Teodora Medici, [employer] ?05/01/21 1316 ? ?

## 2021-05-01 NOTE — Discharge Instructions (Signed)
Your x-ray was negative.  Please change dressing daily.  Follow-up if infection signs to your left elbow occur that include increased redness, swelling, pus. ?

## 2021-05-01 NOTE — ED Triage Notes (Signed)
Patient states that she fell yesterday and injured her left elbow.  The elbow is tender to touch, some swelling to the area.  Patient denies any OTC meds. ?

## 2022-04-10 IMAGING — DX DG ELBOW COMPLETE 3+V*L*
4 series · 4 of 4 positions shown · non-contrast
Comparison: None.

CLINICAL DATA: Fall yesterday with left elbow injury.

EXAM:
LEFT ELBOW - COMPLETE 3+ VIEW

[elbow ap (1 of 3)]
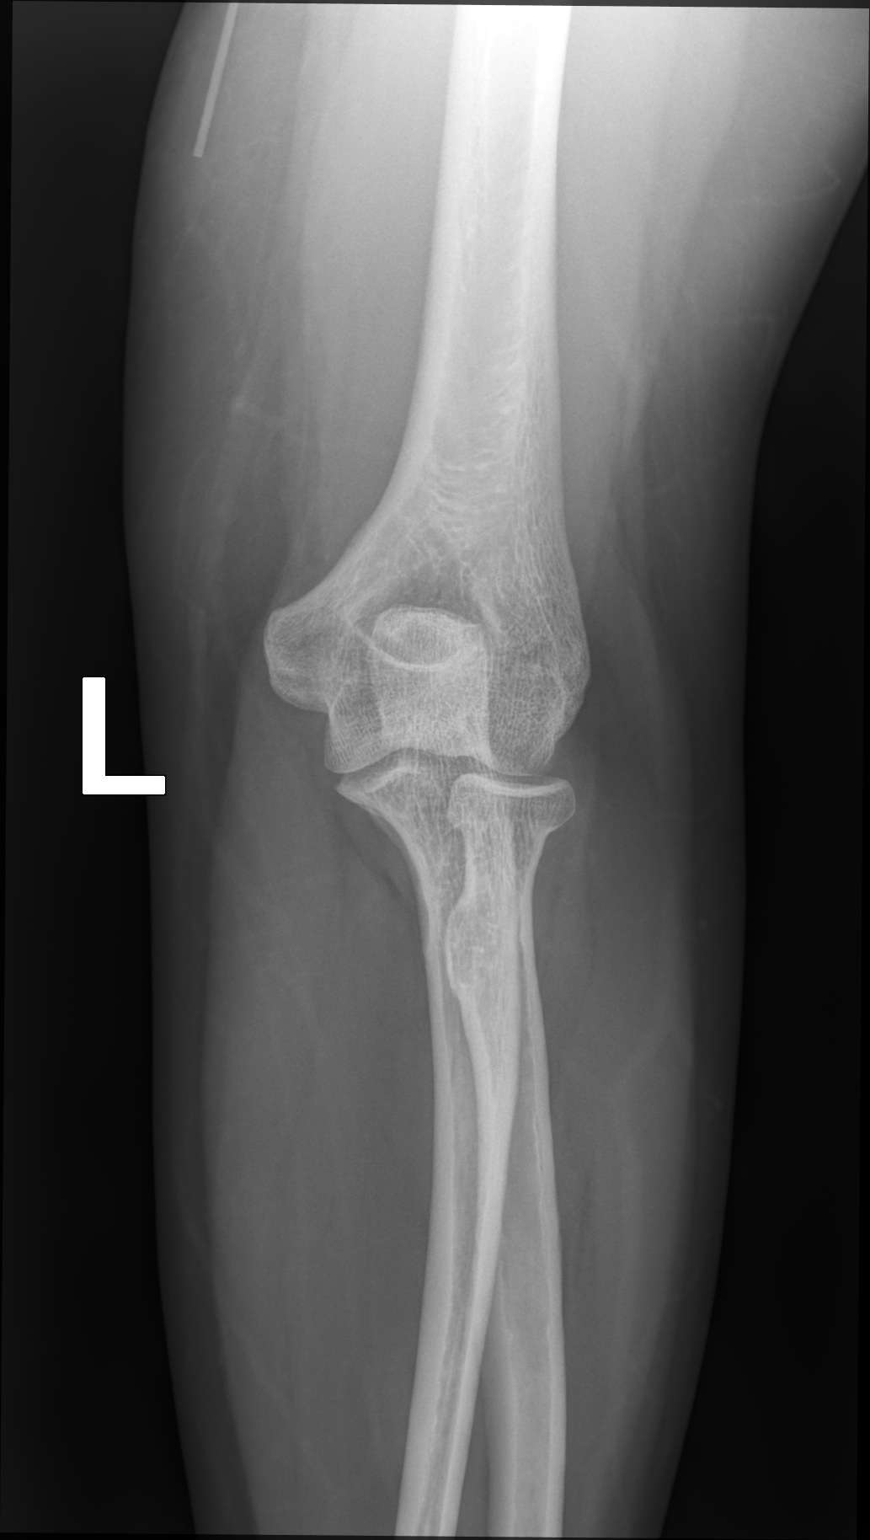

[elbow ap (2 of 3)]
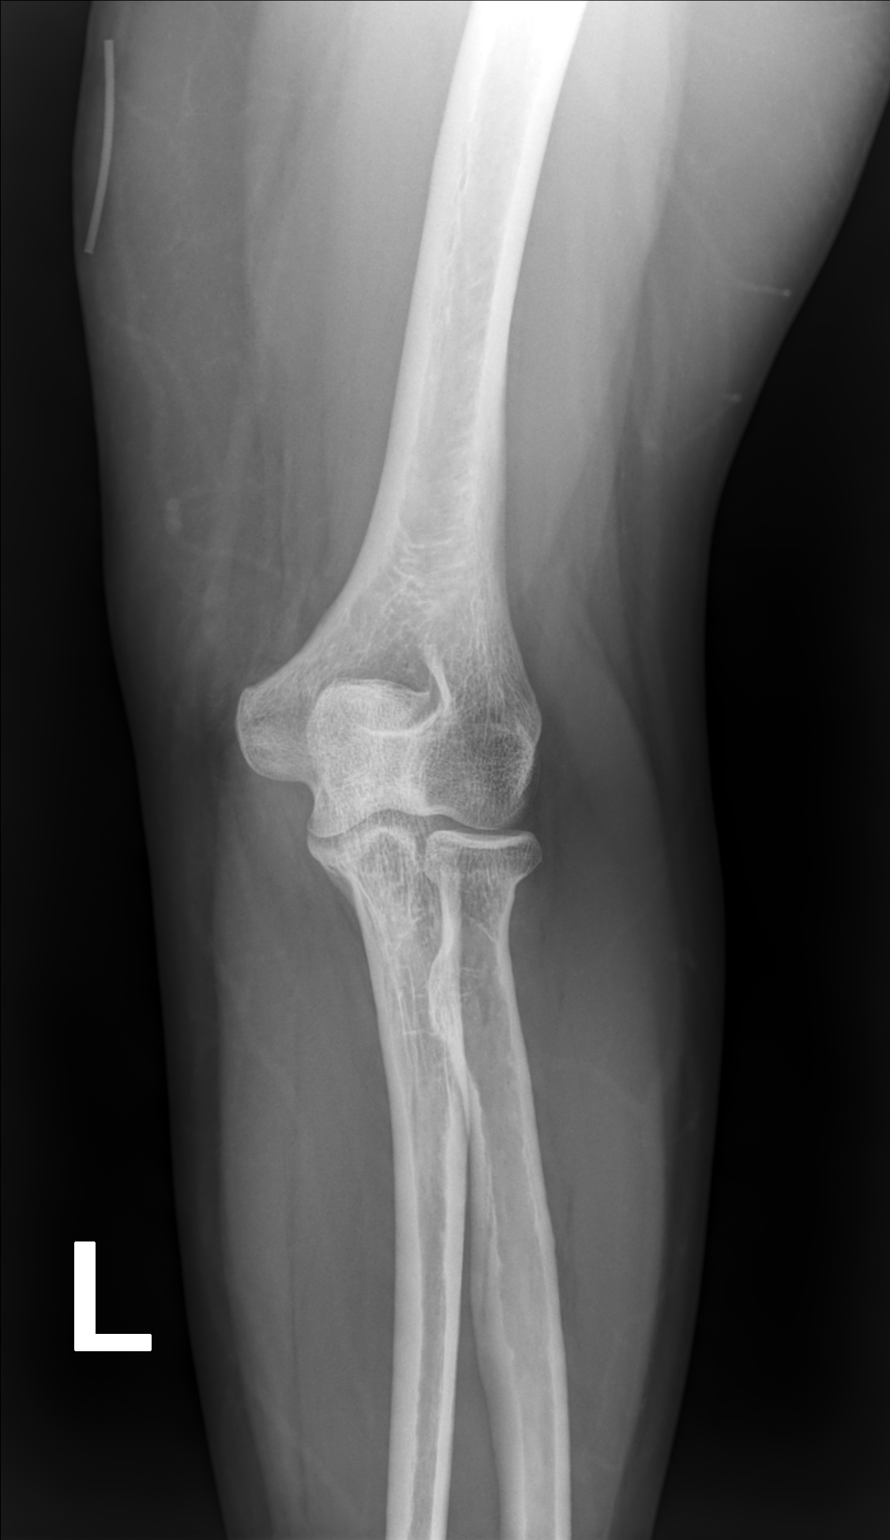

[elbow ap (3 of 3)]
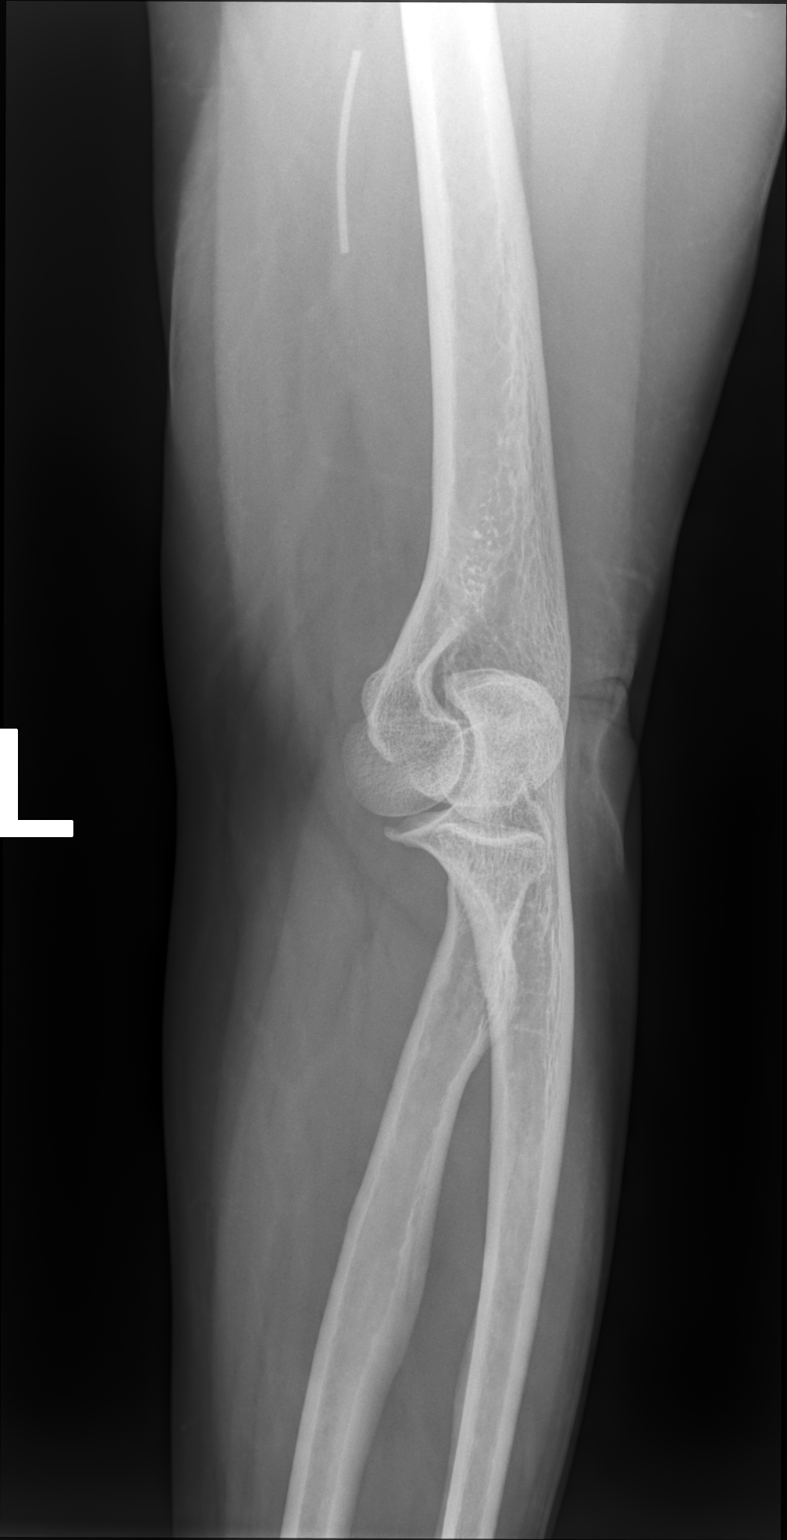

[elbow lat]
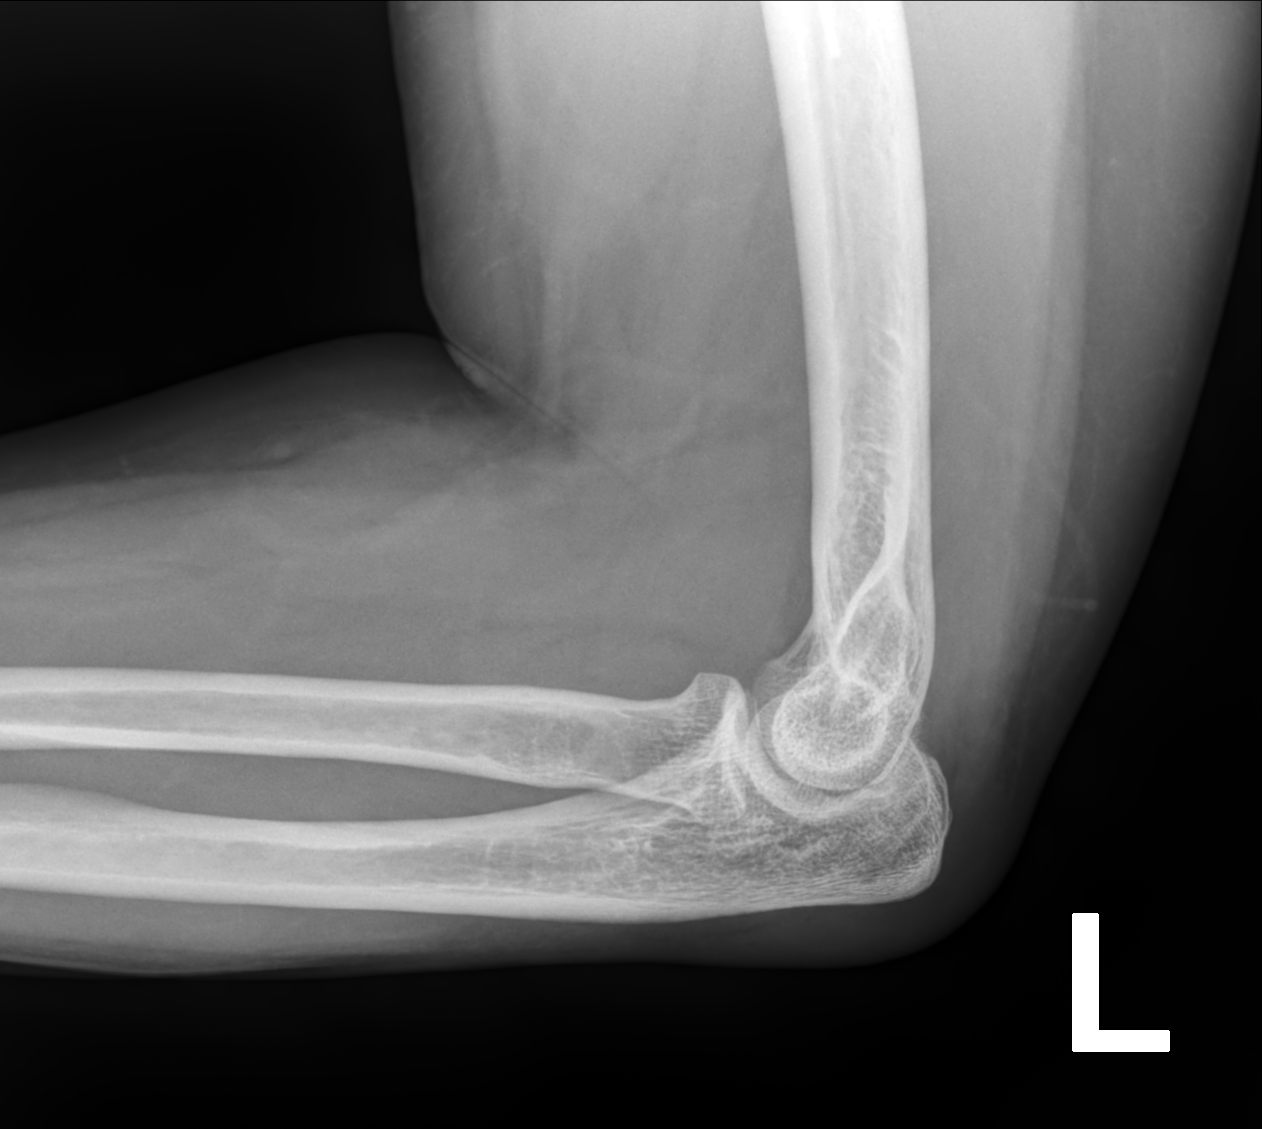

[4 of 4 positions shown; findings below may reference images not displayed]

FINDINGS: There is no evidence of fracture, dislocation, or joint effusion.
There is no evidence of arthropathy or other focal bone abnormality.
Soft tissues are unremarkable.
IMPRESSION: Negative.

## 2023-06-28 ENCOUNTER — Other Ambulatory Visit: Payer: Self-pay

## 2023-06-28 ENCOUNTER — Emergency Department (HOSPITAL_COMMUNITY)
Admission: EM | Admit: 2023-06-28 | Discharge: 2023-06-28 | Disposition: A | Discharge status: Home / Self Care | Attending: Emergency Medicine | Admitting: Emergency Medicine

## 2023-06-28 ENCOUNTER — Encounter (HOSPITAL_COMMUNITY): Payer: Self-pay | Admitting: *Deleted

## 2023-06-28 DIAGNOSIS — S61219A Laceration without foreign body of unspecified finger without damage to nail, initial encounter: Secondary | ICD-10-CM

## 2023-06-28 DIAGNOSIS — W293XXA Contact with powered garden and outdoor hand tools and machinery, initial encounter: Secondary | ICD-10-CM | POA: Insufficient documentation

## 2023-06-28 DIAGNOSIS — S61213A Laceration without foreign body of left middle finger without damage to nail, initial encounter: Secondary | ICD-10-CM | POA: Diagnosis not present

## 2023-06-28 DIAGNOSIS — S61215A Laceration without foreign body of left ring finger without damage to nail, initial encounter: Secondary | ICD-10-CM | POA: Diagnosis present

## 2023-06-28 MED ORDER — BACITRACIN ZINC 500 UNIT/GM EX OINT
TOPICAL_OINTMENT | CUTANEOUS | Status: AC
  Filled 2023-06-28: qty 0.9

## 2023-06-28 NOTE — ED Notes (Signed)
Bacitracin and bandage applied

## 2023-06-28 NOTE — ED Provider Notes (Signed)
 [facility] [facility] Provider Note   CSN: [id number] Arrival date & time: 06/28/23  4098     History Chief Complaint  Patient presents with   Laceration    Patricia Gray is a 43 y.o. female.  Patient presents the emergency department today for concerns of laceration.  She reports that she was using hedge clippers when she cut into her left ring and middle fingers.  States that she has some small scratches to the fingertips.  Reports the bleeding was well-controlled at home prior to arriving.  She is here because she is unsure if she needs stitches or not.   Laceration      Home Medications Prior to Admission medications   Not on File      Allergies    Aspirin    Review of Systems   Review of Systems  Skin:  Positive for wound.  All other systems reviewed and are negative.   Physical Exam Updated Vital Signs BP (!) 131/99 (BP Location: Right Arm)   Pulse 97   Temp 98.4 F (36.9 C)   Resp 20   Ht 5\' 2"  (1.575 m)   Wt 101.6 kg   LMP 05/04/2023   SpO2 97%   BMI 40.97 kg/m  Physical Exam Vitals and nursing note reviewed.  Constitutional:      General: She is not in acute distress.    Appearance: She is well-developed.  HENT:     Head: Normocephalic and atraumatic.  Eyes:     Conjunctiva/sclera: Conjunctivae normal.  Cardiovascular:     Rate and Rhythm: Normal rate and regular rhythm.     Heart sounds: No murmur heard. Pulmonary:     Effort: Pulmonary effort is normal. No respiratory distress.     Breath sounds: Normal breath sounds.  Abdominal:     Palpations: Abdomen is soft.     Tenderness: There is no abdominal tenderness.  Musculoskeletal:        General: No swelling.     Cervical back: Neck supple.  Skin:    General: Skin is warm and dry.     Capillary Refill: Capillary refill takes less than 2 seconds.     Findings: Lesion present.     Comments: Small linear lacerations noted to both the 3rd and 4th  digits of the left hand towards the distal tips of the fingers.  Minimal depth.  Not actively bleeding.  Neurological:     Mental Status: She is alert.  Psychiatric:        Mood and Affect: Mood normal.     ED Results / Procedures / Treatments   Labs (all labs ordered are listed, but only abnormal results are displayed) Labs Reviewed - No data to display  EKG None  Radiology No results found.  Procedures Procedures    Medications Ordered in ED Medications  bacitracin  500 UNIT/GM ointment (has no administration in time range)    ED Course/ Medical Decision Making/ A&P                                 Medical Decision Making  This patient presents to the ED for concern of laceration.  Differential diagnosis includes deep laceration, nerve injury, tendon injury, finger fracture, retained foreign body   Problem List / ED Course:  Patient presented to the emergency department today with concerns of a laceration.  Reports using hedge clippers  when she cut into her left ring and middle fingers.  States that she is up-to-date on tetanus with last dose was around 4 to 5 years ago.  She is here today as she is unsure if she may need stitches on these lacerations.  Bleeding was well-controlled prior to arriving. On exam, patient has linear lesions to both the ring and middle fingers of the left hand.  These are approximately 1.5 cm each.  There is minimal depth to these.  There is no active bleeding.  I do not feel that these lesions are conducive to suture repair.  Given the area, skin glue also not advisable.  Will keep these areas clean and wrapped with bandages to allow for healing without closure.  No signs of infection or any retained foreign objects at this time.  I believe the patient is otherwise stable for outpatient follow-up and discharged home in stable condition.  Final Clinical Impression(s) / ED Diagnoses Final diagnoses:  Laceration of finger of left hand without  foreign body without damage to nail, unspecified finger, initial encounter    Rx / DC Orders ED Discharge Orders     None         Concetta Dee, PA-C 06/28/23 2313    Sallyanne Creamer, DO 07/01/23 1512

## 2023-06-28 NOTE — Discharge Instructions (Signed)
 You were seen in the emergency department today for concerns of lacerations to your fingers.  The wounds and cells did not appear to be deep enough to require sutures so they were bandaged up after being clean.  Please keep an eye on these to make sure that there is no signs of infection may develop.  If any sign infection were to develop, please return the emergency department or follow-up with your primary care provider for further evaluation.  Keep the area clean and dry and avoid soaking the fingers in water for prolonged periods of time such as in a pool, hot tub, etc.

## 2023-06-28 NOTE — ED Triage Notes (Signed)
 The pt was using her hedge clippers and she clipped her lt ring and middle fingers  small scratches oh the tips of her fingers  much bleeding. Cleaned wounds in triage lmp march ens

## 2024-02-24 ENCOUNTER — Other Ambulatory Visit: Payer: Self-pay

## 2024-02-24 ENCOUNTER — Ambulatory Visit
Admission: RE | Admit: 2024-02-24 | Discharge: 2024-02-24 | Disposition: A | Discharge status: Home / Self Care | Payer: Self-pay | Source: Ambulatory Visit | Attending: Internal Medicine | Admitting: Internal Medicine

## 2024-02-24 VITALS — BP 118/81 | HR 76 | Temp 98.0°F | Resp 18

## 2024-02-24 DIAGNOSIS — F17210 Nicotine dependence, cigarettes, uncomplicated: Secondary | ICD-10-CM

## 2024-02-24 DIAGNOSIS — U071 COVID-19: Secondary | ICD-10-CM

## 2024-02-24 DIAGNOSIS — J209 Acute bronchitis, unspecified: Secondary | ICD-10-CM

## 2024-02-24 LAB — POC SOFIA SARS ANTIGEN FIA: SARS Coronavirus 2 Ag: POSITIVE — AB

## 2024-02-24 MED ORDER — ALBUTEROL SULFATE HFA 108 (90 BASE) MCG/ACT IN AERS: 1.0000 | INHALATION_SPRAY | Freq: Four times a day (QID) | RESPIRATORY_TRACT | 0 refills | Status: AC | PRN

## 2024-02-24 MED ORDER — PREDNISONE 20 MG PO TABS: 40.0000 mg | ORAL_TABLET | Freq: Every day | ORAL | 0 refills | Status: AC

## 2024-02-24 MED ORDER — GUAIFENESIN ER 1200 MG PO TB12: 1200.0000 mg | ORAL_TABLET | Freq: Two times a day (BID) | ORAL | 0 refills | Status: AC

## 2024-02-24 MED ORDER — ONDANSETRON 4 MG PO TBDP: 4.0000 mg | ORAL_TABLET | Freq: Three times a day (TID) | ORAL | 0 refills | Status: AC | PRN

## 2024-02-24 NOTE — Discharge Instructions (Addendum)
 You have COVID-19 viral illness. Wear a mask for 5 days of symptoms. You may return to public within those 5 days as long as you do not have a fever. Wash hands frequently.  Take prednisone  2 pills once daily for the next 5 days to treat bronchitis/wheezing and inflammation to the lungs.  Please use 2 puffs of albuterol  inhaler every 4-6 hours as needed for wheezing and shortness of breath associate with coughing.  Rinse out your mouth after using albuterol  inhaler.  Take Zofran  every 8 hours as needed for nausea and vomiting.  Plain Guaifenesin  (Mucinex ) may help break up mucous.  Nyquil at bedtime as needed for cough.   Two teaspoons of honey in warm water every 4-6 hours may help with throat pains Humidifier in your room at night to help add water the air and soothe cough  If you develop any new or worsening symptoms or if your symptoms do not start to improve, please return here or follow-up with your primary care provider. If your symptoms are severe, please go to the emergency room.

## 2024-02-24 NOTE — ED Provider Notes (Signed)
 " [facility]    CSN: [id number] Arrival date & time: 02/24/24  0830      History   Chief Complaint Chief Complaint  Patient presents with   Chills    Covid test - Entered by patient    HPI Patricia Gray is a 44 y.o. female.   Patricia Gray is a 44 y.o. female presenting for chief complaint of cough, nasal congestion, nausea without vomiting, generalized fatigue, and generalized weakness that started 4 days ago on Saturday, February 21, 2024.  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  She took an at home test for COVID on Sunday, February 22, 2024 and it was positive.  She states her place of work is requiring her to have a test to urgent care to confirm COVID. She is a current everyday cigarette smoker and reports intermittent shortness of breath associated with coughing.  Additionally reports nausea without vomiting over the last 2 to 3 days.  She is unsure of max temp at home.  Denies vomiting, diarrhea, abdominal pain, rashes, and dizziness.  Denies history of asthma or chronic respiratory problems.  She is currently on her menstrual cycle (LMP started February 23, 2024), denies chance of pregnancy.  She has never been hospitalized for COVID and has actually never tested positive for COVID-19 in the past.  Using over-the-counter medications with some relief of symptoms.     Past Medical History:  Diagnosis Date   ANTI LEA Angelica a)  02/19/2016   O POS     Patient Active Problem List   Diagnosis Date Noted   ANTI LEA Angelica a)  02/19/2016   Normal labor 02/12/2016    History reviewed. No pertinent surgical history.  OB History     Gravida  6   Para  5   Term  5   Preterm  0   AB  1   Living  5      SAB  0   IAB  1   Ectopic  0   Multiple  0   Live Births  5            Home Medications    Prior to Admission medications  Medication Sig Start Date End Date Taking?  Authorizing Provider  albuterol  (VENTOLIN  HFA) 108 (90 Base) MCG/ACT inhaler Inhale 1-2 puffs into the lungs every 6 (six) hours as needed for wheezing or shortness of breath. 02/24/24  Yes Enedelia Dorna HERO, FNP  Guaifenesin  1200 MG TB12 Take 1 tablet (1,200 mg total) by mouth in the morning and at bedtime. 02/24/24  Yes Enedelia Dorna HERO, FNP  ondansetron  (ZOFRAN -ODT) 4 MG disintegrating tablet Take 1 tablet (4 mg total) by mouth every 8 (eight) hours as needed for nausea or vomiting. 02/24/24  Yes Enedelia Dorna HERO, FNP  predniSONE  (DELTASONE ) 20 MG tablet Take 2 tablets (40 mg total) by mouth daily with breakfast for 5 days. 02/24/24 02/29/24 Yes Leeann Bady, Dorna HERO, FNP    Family History Family History  Problem Relation Age of Onset   Hypertension Mother    Cancer Mother    Hypertension Sister     Social History Social History[1]   Allergies   Aspirin   Review of Systems Review of Systems Per HPI  Physical Exam Triage Vital Signs ED Triage Vitals  Encounter Vitals Group     BP 02/24/24 0846 118/81     Girls Systolic BP Percentile --  Girls Diastolic BP Percentile --      Boys Systolic BP Percentile --      Boys Diastolic BP Percentile --      Pulse Rate 02/24/24 0846 76     Resp 02/24/24 0846 18     Temp 02/24/24 0846 98 F (36.7 C)     Temp Source 02/24/24 0846 Oral     SpO2 02/24/24 0846 98 %     Weight --      Height --      Head Circumference --      Peak Flow --      Pain Score 02/24/24 0847 0     Pain Loc --      Pain Education --      Exclude from Growth Chart --    No data found.  Updated Vital Signs BP 118/81 (BP Location: Left Arm)   Pulse 76   Temp 98 F (36.7 C) (Oral)   Resp 18   SpO2 98%   Visual Acuity Right Eye Distance:   Left Eye Distance:   Bilateral Distance:    Right Eye Near:   Left Eye Near:    Bilateral Near:     Physical Exam Vitals and nursing note reviewed.  Constitutional:      Appearance: She is  ill-appearing. She is not toxic-appearing.  HENT:     Head: Normocephalic and atraumatic.     Right Ear: Hearing, tympanic membrane, ear canal and external ear normal.     Left Ear: Hearing, tympanic membrane, ear canal and external ear normal.     Nose: Congestion present.     Mouth/Throat:     Lips: Pink.     Mouth: Mucous membranes are moist. No injury or oral lesions.     Dentition: Normal dentition.     Tongue: No lesions.     Pharynx: Oropharynx is clear. Uvula midline. No pharyngeal swelling, oropharyngeal exudate, posterior oropharyngeal erythema, uvula swelling or postnasal drip.     Tonsils: No tonsillar exudate.  Eyes:     General: Lids are normal. Vision grossly intact. Gaze aligned appropriately.     Extraocular Movements: Extraocular movements intact.     Conjunctiva/sclera: Conjunctivae normal.  Neck:     Trachea: Trachea and phonation normal.  Cardiovascular:     Rate and Rhythm: Normal rate and regular rhythm.     Heart sounds: Normal heart sounds, S1 normal and S2 normal.  Pulmonary:     Effort: Pulmonary effort is normal. No respiratory distress.     Breath sounds: Normal breath sounds and air entry. No wheezing, rhonchi or rales.     Comments: Harsh and deep cough on exam with deep inspiration.  Coarse breath sounds throughout.  Speaking in full sentences without difficulty. Chest:     Chest wall: No tenderness.  Musculoskeletal:     Cervical back: Neck supple.  Lymphadenopathy:     Cervical: No cervical adenopathy.  Skin:    General: Skin is warm and dry.     Capillary Refill: Capillary refill takes less than 2 seconds.     Findings: No rash.  Neurological:     General: No focal deficit present.     Mental Status: She is alert and oriented to person, place, and time. Mental status is at baseline.     Cranial Nerves: No dysarthria or facial asymmetry.  Psychiatric:        Mood and Affect: Mood normal.  Speech: Speech normal.        Behavior:  Behavior normal.        Thought Content: Thought content normal.        Judgment: Judgment normal.      UC Treatments / Results  Labs (all labs ordered are listed, but only abnormal results are displayed) Labs Reviewed  POC SOFIA SARS ANTIGEN FIA - Abnormal; Notable for the following components:      Result Value   SARS Coronavirus 2 Ag Positive (*)    All other components within normal limits    EKG   Radiology No results found.  Procedures Procedures (including critical care time)  Medications Ordered in UC Medications - No data to display  Initial Impression / Assessment and Plan / UC Course  I have reviewed the triage vital signs and the nursing notes.  Pertinent labs & imaging results that were available during my care of the patient were reviewed by me and considered in my medical decision making (see chart for details).   1.  COVID-19, acute bronchitis, cigarette nicotine dependence without complication COVID testing positive at home and in clinic (place of work wanted to retest).  Lungs clear, vitals hemodynamically stable, therefore deferred imaging of the chest.  Considered Paxlovid prescription, however she is currently on day 4 of symptoms and I do not have an updated GFR/renal function on file for her within the last 8 years.  We discussed option for blood draw today and prescription for Paxlovid tomorrow once I am able to see her renal function.  We used shared decision making to decide to forego Paxlovid prescription and we will treat her symptoms instead.  She is exhibiting signs of bronchitis associated with COVID-19 due to cigarette smoking. Albuterol  inhaler 2 puffs every 4-6 hours as needed for shortness of breath and wheezing and prednisone  burst ordered.  She may continue using NyQuil at home. Recommend Zofran  and Mucinex  as needed for nausea and congestion.  Discussed most up to date CDC guidelines regarding quarantine and masking to prevent  transmission.  Recommend supportive care for symptomatic relief as outlined in AVS. Work note given.  Counseled patient on potential for adverse effects with medications prescribed/recommended today, strict ER and return-to-clinic precautions discussed, patient verbalized understanding.    Final Clinical Impressions(s) / UC Diagnoses   Final diagnoses:  COVID-19  Acute bronchitis, unspecified organism  Cigarette nicotine dependence without complication     Discharge Instructions      You have COVID-19 viral illness. Wear a mask for 5 days of symptoms. You may return to public within those 5 days as long as you do not have a fever. Wash hands frequently.  Take prednisone  2 pills once daily for the next 5 days to treat bronchitis/wheezing and inflammation to the lungs.  Please use 2 puffs of albuterol  inhaler every 4-6 hours as needed for wheezing and shortness of breath associate with coughing.  Rinse out your mouth after using albuterol  inhaler.  Take Zofran  every 8 hours as needed for nausea and vomiting.  Plain Guaifenesin  (Mucinex ) may help break up mucous.  Nyquil at bedtime as needed for cough.   Two teaspoons of honey in warm water every 4-6 hours may help with throat pains Humidifier in your room at night to help add water the air and soothe cough  If you develop any new or worsening symptoms or if your symptoms do not start to improve, please return here or follow-up with your primary care provider.  If your symptoms are severe, please go to the emergency room.      ED Prescriptions     Medication Sig Dispense Auth. Provider   ondansetron  (ZOFRAN -ODT) 4 MG disintegrating tablet Take 1 tablet (4 mg total) by mouth every 8 (eight) hours as needed for nausea or vomiting. 20 tablet Enedelia Dorna HERO, FNP   albuterol  (VENTOLIN  HFA) 108 (90 Base) MCG/ACT inhaler Inhale 1-2 puffs into the lungs every 6 (six) hours as needed for wheezing or shortness of breath. 18 g  Laberta Wilbon M, FNP   predniSONE  (DELTASONE ) 20 MG tablet Take 2 tablets (40 mg total) by mouth daily with breakfast for 5 days. 10 tablet Enedelia Dorna HERO, FNP   Guaifenesin  1200 MG TB12 Take 1 tablet (1,200 mg total) by mouth in the morning and at bedtime. 14 tablet Enedelia Dorna HERO, FNP      PDMP not reviewed this encounter.    [1]  Social History Tobacco Use   Smoking status: Every Day    Current packs/day: 0.25    Types: Cigarettes   Smokeless tobacco: Never  Vaping Use   Vaping status: Never Used  Substance Use Topics   Alcohol use: Yes    Alcohol/week: 6.0 standard drinks of alcohol    Types: 6 Glasses of wine per week    Comment: weekly   Drug use: No     Enedelia Dorna HERO, FNP 02/24/24 [phone]  "

## 2024-02-24 NOTE — ED Triage Notes (Signed)
 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
